# Patient Record
Sex: Female | Born: 1975 | Race: Black or African American | Hispanic: No | Smoking: Never smoker
Health system: Southern US, Community
[De-identification: ages and names within clinical notes are randomized; demographics above are authoritative.]

## PROBLEM LIST (undated history)

## (undated) DIAGNOSIS — J45909 Unspecified asthma, uncomplicated: Secondary | ICD-10-CM

## (undated) DIAGNOSIS — Z86711 Personal history of pulmonary embolism: Secondary | ICD-10-CM

## (undated) HISTORY — DX: Personal history of pulmonary embolism: Z86.711

## (undated) HISTORY — PX: TUBAL LIGATION: SHX77

## (undated) HISTORY — PX: ENDOMETRIAL ABLATION: SHX621

## (undated) HISTORY — DX: Unspecified asthma, uncomplicated: J45.909

---

## 2008-05-29 ENCOUNTER — Other Ambulatory Visit: Admission: RE | Admit: 2008-05-29 | Discharge: 2008-05-29 | Payer: Self-pay | Admitting: Obstetrics and Gynecology

## 2010-12-07 HISTORY — PX: PLACEMENT OF BREAST IMPLANTS: SHX6334

## 2016-12-21 DIAGNOSIS — Z1231 Encounter for screening mammogram for malignant neoplasm of breast: Secondary | ICD-10-CM | POA: Diagnosis not present

## 2016-12-21 LAB — HM MAMMOGRAPHY

## 2017-07-06 DIAGNOSIS — Z01419 Encounter for gynecological examination (general) (routine) without abnormal findings: Secondary | ICD-10-CM | POA: Diagnosis not present

## 2017-07-06 DIAGNOSIS — Z87898 Personal history of other specified conditions: Secondary | ICD-10-CM | POA: Diagnosis not present

## 2017-07-08 LAB — HM PAP SMEAR: HM PAP: NEGATIVE

## 2017-10-05 ENCOUNTER — Ambulatory Visit (INDEPENDENT_AMBULATORY_CARE_PROVIDER_SITE_OTHER): Payer: 59 | Admitting: Family Medicine

## 2017-10-05 ENCOUNTER — Encounter: Payer: Self-pay | Admitting: Family Medicine

## 2017-10-05 VITALS — BP 110/70 | HR 61 | Ht 65.5 in | Wt 142.4 lb

## 2017-10-05 DIAGNOSIS — Z0000 Encounter for general adult medical examination without abnormal findings: Secondary | ICD-10-CM

## 2017-10-05 DIAGNOSIS — J45909 Unspecified asthma, uncomplicated: Secondary | ICD-10-CM | POA: Insufficient documentation

## 2017-10-05 DIAGNOSIS — H547 Unspecified visual loss: Secondary | ICD-10-CM

## 2017-10-05 DIAGNOSIS — Z86711 Personal history of pulmonary embolism: Secondary | ICD-10-CM | POA: Insufficient documentation

## 2017-10-05 LAB — LIPID PANEL
CHOL/HDL RATIO: 2.7 (calc) (ref <5.0)
CHOLESTEROL: 234 mg/dL — AB (ref <200)
HDL: 88 mg/dL (ref >50)
LDL Cholesterol (Calc): 131 mg/dL (calc) — ABNORMAL HIGH
NON-HDL CHOLESTEROL (CALC): 146 mg/dL — AB (ref <130)
TRIGLYCERIDES: 62 mg/dL (ref <150)

## 2017-10-05 LAB — POCT URINALYSIS DIP (PROADVANTAGE DEVICE)
BILIRUBIN UA: NEGATIVE
BILIRUBIN UA: NEGATIVE mg/dL
Blood, UA: NEGATIVE
GLUCOSE UA: NEGATIVE mg/dL
LEUKOCYTES UA: NEGATIVE
NITRITE UA: NEGATIVE
Protein Ur, POC: NEGATIVE mg/dL
Specific Gravity, Urine: 1.015
Urobilinogen, Ur: NEGATIVE
pH, UA: 6 (ref 5.0–8.0)

## 2017-10-05 LAB — CBC WITH DIFFERENTIAL/PLATELET
BASOS ABS: 30 {cells}/uL (ref 0–200)
BASOS PCT: 0.5 %
EOS ABS: 71 {cells}/uL (ref 15–500)
Eosinophils Relative: 1.2 %
HEMATOCRIT: 36.7 % (ref 35.0–45.0)
HEMOGLOBIN: 12.2 g/dL (ref 11.7–15.5)
LYMPHS ABS: 2513 {cells}/uL (ref 850–3900)
MCH: 29.9 pg (ref 27.0–33.0)
MCHC: 33.2 g/dL (ref 32.0–36.0)
MCV: 90 fL (ref 80.0–100.0)
MPV: 13.6 fL — AB (ref 7.5–12.5)
Monocytes Relative: 6.9 %
NEUTROS ABS: 2879 {cells}/uL (ref 1500–7800)
Neutrophils Relative %: 48.8 %
Platelets: 151 10*3/uL (ref 140–400)
RBC: 4.08 10*6/uL (ref 3.80–5.10)
RDW: 12 % (ref 11.0–15.0)
Total Lymphocyte: 42.6 %
WBC mixed population: 407 cells/uL (ref 200–950)
WBC: 5.9 10*3/uL (ref 3.8–10.8)

## 2017-10-05 LAB — COMPREHENSIVE METABOLIC PANEL
AG Ratio: 1.4 (calc) (ref 1.0–2.5)
ALKALINE PHOSPHATASE (APISO): 59 U/L (ref 33–115)
ALT: 12 U/L (ref 6–29)
AST: 22 U/L (ref 10–30)
Albumin: 4.3 g/dL (ref 3.6–5.1)
BILIRUBIN TOTAL: 0.5 mg/dL (ref 0.2–1.2)
BUN: 17 mg/dL (ref 7–25)
CHLORIDE: 103 mmol/L (ref 98–110)
CO2: 24 mmol/L (ref 20–32)
CREATININE: 0.92 mg/dL (ref 0.50–1.10)
Calcium: 9.2 mg/dL (ref 8.6–10.2)
GLOBULIN: 3 g/dL (ref 1.9–3.7)
GLUCOSE: 79 mg/dL (ref 65–99)
Potassium: 4.5 mmol/L (ref 3.5–5.3)
Sodium: 136 mmol/L (ref 135–146)
TOTAL PROTEIN: 7.3 g/dL (ref 6.1–8.1)

## 2017-10-05 NOTE — Patient Instructions (Addendum)
Call and schedule a dental cleaning.  Also, call and schedule an eye exam.   We will call you with your lab results.    Preventative Care for Adults - Female      MAINTAIN REGULAR HEALTH EXAMS:  A routine yearly physical is a good way to check in with your primary care provider about your health and preventive screening. It is also an opportunity to share updates about your health and any concerns you have, and receive a thorough all-over exam.   Most health insurance companies pay for at least some preventative services.  Check with your health plan for specific coverages.  WHAT PREVENTATIVE SERVICES DO WOMEN NEED?  Adult women should have their weight and blood pressure checked regularly.   Women age 41 and older should have their cholesterol levels checked regularly.  Women should be screened for cervical cancer with a Pap smear and pelvic exam beginning at age 41.   Breast cancer screening generally begins at age 41 with a mammogram and breast exam by your primary care provider.    Beginning at age 41 and continuing to age 41, women should be screened for colorectal cancer.  Certain people may need continued testing until age 41.  Updating vaccinations is part of preventative care.  Vaccinations help protect against diseases such as the flu.  Osteoporosis is a disease in which the bones lose minerals and strength as we age. Women ages 4865 and over should discuss this with their caregivers, as should women after menopause who have other risk factors.  Lab tests are generally done as part of preventative care to screen for anemia and blood disorders, to screen for problems with the kidneys and liver, to screen for bladder problems, to check blood sugar, and to check your cholesterol level.  Preventative services generally include counseling about diet, exercise, avoiding tobacco, drugs, excessive alcohol consumption, and sexually transmitted infections.    GENERAL RECOMMENDATIONS  FOR GOOD HEALTH:  Healthy diet:  Eat a variety of foods, including fruit, vegetables, animal or vegetable protein, such as meat, fish, chicken, and eggs, or beans, lentils, tofu, and grains, such as rice.  Drink plenty of water daily.  Decrease saturated fat in the diet, avoid lots of red meat, processed foods, sweets, fast foods, and fried foods.  Exercise:  Aerobic exercise helps maintain good heart health. At least 30-40 minutes of moderate-intensity exercise is recommended. For example, a brisk walk that increases your heart rate and breathing. This should be done on most days of the week.   Find a type of exercise or a variety of exercises that you enjoy so that it becomes a part of your daily life.  Examples are running, walking, swimming, water aerobics, and biking.  For motivation and support, explore group exercise such as aerobic class, spin class, Zumba, Yoga,or  martial arts, etc.    Set exercise goals for yourself, such as a certain weight goal, walk or run in a race such as a 5k walk/run.  Speak to your primary care provider about exercise goals.  Disease prevention:  If you smoke or chew tobacco, find out from your caregiver how to quit. It can literally save your life, no matter how long you have been a tobacco user. If you do not use tobacco, never begin.   Maintain a healthy diet and normal weight. Increased weight leads to problems with blood pressure and diabetes.   The Body Mass Index or BMI is a way of measuring how  much of your body is fat. Having a BMI above 27 increases the risk of heart disease, diabetes, hypertension, stroke and other problems related to obesity. Your caregiver can help determine your BMI and based on it develop an exercise and dietary program to help you achieve or maintain this important measurement at a healthful level.  High blood pressure causes heart and blood vessel problems.  Persistent high blood pressure should be treated with medicine  if weight loss and exercise do not work.   Fat and cholesterol leaves deposits in your arteries that can block them. This causes heart disease and vessel disease elsewhere in your body.  If your cholesterol is found to be high, or if you have heart disease or certain other medical conditions, then you may need to have your cholesterol monitored frequently and be treated with medication.   Ask if you should have a cardiac stress test if your history suggests this. A stress test is a test done on a treadmill that looks for heart disease. This test can find disease prior to there being a problem.  Menopause can be associated with physical symptoms and risks. Hormone replacement therapy is available to decrease these. You should talk to your caregiver about whether starting or continuing to take hormones is right for you.   Osteoporosis is a disease in which the bones lose minerals and strength as we age. This can result in serious bone fractures. Risk of osteoporosis can be identified using a bone density scan. Women ages 11 and over should discuss this with their caregivers, as should women after menopause who have other risk factors. Ask your caregiver whether you should be taking a calcium supplement and Vitamin D, to reduce the rate of osteoporosis.   Avoid drinking alcohol in excess (more than two drinks per day).  Avoid use of street drugs. Do not share needles with anyone. Ask for professional help if you need assistance or instructions on stopping the use of alcohol, cigarettes, and/or drugs.  Brush your teeth twice a day with fluoride toothpaste, and floss once a day. Good oral hygiene prevents tooth decay and gum disease. The problems can be painful, unattractive, and can cause other health problems. Visit your dentist for a routine oral and dental check up and preventive care every 6-12 months.   Look at your skin regularly.  Use a mirror to look at your back. Notify your caregivers of changes  in moles, especially if there are changes in shapes, colors, a size larger than a pencil eraser, an irregular border, or development of new moles.  Safety:  Use seatbelts 100% of the time, whether driving or as a passenger.  Use safety devices such as hearing protection if you work in environments with loud noise or significant background noise.  Use safety glasses when doing any work that could send debris in to the eyes.  Use a helmet if you ride a bike or motorcycle.  Use appropriate safety gear for contact sports.  Talk to your caregiver about gun safety.  Use sunscreen with a SPF (or skin protection factor) of 15 or greater.  Lighter skinned people are at a greater risk of skin cancer. Don't forget to also wear sunglasses in order to protect your eyes from too much damaging sunlight. Damaging sunlight can accelerate cataract formation.   Practice safe sex. Use condoms. Condoms are used for birth control and to help reduce the spread of sexually transmitted infections (or STIs).  Some of the STIs  are gonorrhea (the clap), chlamydia, syphilis, trichomonas, herpes, HPV (human papilloma virus) and HIV (human immunodeficiency virus) which causes AIDS. The herpes, HIV and HPV are viral illnesses that have no cure. These can result in disability, cancer and death.   Keep carbon monoxide and smoke detectors in your home functioning at all times. Change the batteries every 6 months or use a model that plugs into the wall.   Vaccinations:  Stay up to date with your tetanus shots and other required immunizations. You should have a booster for tetanus every 10 years. Be sure to get your flu shot every year, since 5%-20% of the [place] population comes down with the flu. The flu vaccine changes each year, so being vaccinated once is not enough. Get your shot in the fall, before the flu season peaks.   Other vaccines to consider:  Human Papilloma Virus or HPV causes cancer of the cervix, and other infections  that can be transmitted from person to person. There is a vaccine for HPV, and females should get immunized between the ages of 52 and 25. It requires a series of 3 shots.   Pneumococcal vaccine to protect against certain types of pneumonia.  This is normally recommended for adults age 44 or older.  However, adults younger than 41 years old with certain underlying conditions such as diabetes, heart or lung disease should also receive the vaccine.  Shingles vaccine to protect against Varicella Zoster if you are older than age 64, or younger than 41 years old with certain underlying illness.  Hepatitis A vaccine to protect against a form of infection of the liver by a virus acquired from food.  Hepatitis B vaccine to protect against a form of infection of the liver by a virus acquired from blood or body fluids, particularly if you work in health care.  If you plan to travel internationally, check with your local health department for specific vaccination recommendations.  Cancer Screening:  Breast cancer screening is essential to preventive care for women. All women age 33 and older should perform a breast self-exam every month. At age 28 and older, women should have their caregiver complete a breast exam each year. Women at ages 70 and older should have a mammogram (x-ray film) of the breasts. Your caregiver can discuss how often you need mammograms.    Cervical cancer screening includes taking a Pap smear (sample of cells examined under a microscope) from the cervix (end of the uterus). It also includes testing for HPV (Human Papilloma Virus, which can cause cervical cancer). Screening and a pelvic exam should begin at age 1, or 3 years after a woman becomes sexually active. Screening should occur every year, with a Pap smear but no HPV testing, up to age 49. After age 64, you should have a Pap smear every 3 years with HPV testing, if no HPV was found previously.   Most routine colon cancer  screening begins at the age of 80. On a yearly basis, doctors may provide special easy to use take-home tests to check for hidden blood in the stool. Sigmoidoscopy or colonoscopy can detect the earliest forms of colon cancer and is life saving. These tests use a small camera at the end of a tube to directly examine the colon. Speak to your caregiver about this at age 40, when routine screening begins (and is repeated every 5 years unless early forms of pre-cancerous polyps or small growths are found).

## 2017-10-05 NOTE — Progress Notes (Signed)
Subjective:    Patient ID: Sara Allen, female    DOB: [birth year], 41 y.o.   MRN: [id number]  HPI Chief Complaint  Patient presents with  . new pt    new pt, fasting cpe. has form that needs to be filled out. sees obgyn. will get flu shot with her daughter   She is new to the practice and here for a complete physical exam. Previous medical care: no PCP in a while.  Last CPE: last year   Other providers: [facility]   History of PE x 2. This was worked up and denies bleeding disorder.  Reports first PE was related to birth control and her second was related to pregnancy. States she does not need anticoagulation.  Work up done at [facility], last one in ?2012.   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  Masters in Hotel managerkinesiology and health care admin.  Played college basketball at [employer].   States her family is in [place].  Diet: healthy diet  Excerise: 3-4 days per week.   Immunizations: Tdap up to date. Will get flu shot but not today.   Health maintenance:  Mammogram: March 2018  Colonoscopy: N/A Last Gynecological Exam: this year at her OB/GYN Last Menstrual cycle: ablation.  Last Dental Exam: 3 years ago  Last Eye Exam: years   Wears seatbelt always, smoke detectors in home and functioning, does not text while driving and feels safe in home environment.   Reviewed allergies, medications, past medical, surgical, family, and social history.    Review of Systems Review of Systems Constitutional: -fever, -chills, -sweats, -unexpected weight change,-fatigue ENT: -runny nose, -ear pain, -sore throat Cardiology:  -chest pain, -palpitations, -edema Respiratory: -cough, -shortness of breath, -wheezing Gastroenterology: -abdominal pain, -nausea, -vomiting, -diarrhea, -constipation  Hematology: -bleeding or bruising problems Musculoskeletal:  -arthralgias, -myalgias, -joint swelling, -back pain Ophthalmology: -vision changes Urology: -dysuria, -difficulty urinating, -hematuria, -urinary frequency, -urgency Neurology: -headache, -weakness, -tingling, -numbness       Objective:   Physical Exam BP 110/70   Pulse 61   Ht 5' 5.5" (1.664 m)   Wt 142 lb 6.4 oz (64.6 kg)   BMI 23.34 kg/m   General Appearance:    Alert, cooperative, no distress, appears stated age  Head:    Normocephalic, without obvious abnormality, atraumatic  Eyes:    PERRL, conjunctiva/corneas clear, EOM's intact, fundi    benign  Ears:    Normal TM's and external ear canals  Nose:   Nares normal, mucosa normal, no drainage or sinus   tenderness  Throat:   Lips, mucosa, and tongue normal; teeth and gums normal  Neck:   Supple, no lymphadenopathy;  thyroid:  no   enlargement/tenderness/nodules; no carotid   bruit or JVD  Back:    Spine nontender, no curvature, ROM normal, no CVA     tenderness  Lungs:     Clear to auscultation bilaterally without wheezes, rales or     ronchi; respirations unlabored  Chest Wall:    No tenderness or deformity   Heart:    Regular rate and rhythm, S1 and S2 normal, no murmur, rub   or gallop  Breast Exam:    Done at OB/GYN  Abdomen:     Soft, non-tender, nondistended, normoactive bowel sounds,    no masses, no hepatosplenomegaly  Genitalia:    Done at OB/GYN  Extremities:   No clubbing, cyanosis or edema  Pulses:   2+ and symmetric all extremities  Skin:   Skin color, texture, turgor normal, no rashes or lesions  Lymph nodes:   Cervical, supraclavicular, and axillary nodes normal  Neurologic:   CNII-XII intact, normal strength, sensation and gait; reflexes 2+ and symmetric throughout          Psych:   Normal mood, affect, hygiene and grooming.     Urinalysis dipstick: negative      Assessment & Plan:  Routine general medical examination at a health care facility - Plan: POCT Urinalysis DIP (Proadvantage Device),  CBC with Differential/Platelet, Comprehensive metabolic panel, Lipid panel  Decreased visual acuity  She appears to be doing well emotionally and physically.  Discussed safety and health promotion. She is taking good care of herself.  Will attempt to get medical records from her OB/GYN since she reports having updated mammogram and pap smear.  Follow up pending labs.

## 2017-10-12 ENCOUNTER — Encounter: Payer: Self-pay | Admitting: Family Medicine

## 2017-10-14 ENCOUNTER — Telehealth: Payer: Self-pay | Admitting: Family Medicine

## 2017-10-14 NOTE — Telephone Encounter (Signed)
I put this on your desk.

## 2017-10-14 NOTE — Telephone Encounter (Signed)
Faxed back form.

## 2017-10-14 NOTE — Telephone Encounter (Signed)
Pt faxed over a form for her work. It is a physical form. Please complete and fax to [phone]. Sending back to be completed.

## 2017-10-20 ENCOUNTER — Telehealth: Payer: Self-pay | Admitting: Family Medicine

## 2017-10-20 ENCOUNTER — Encounter: Payer: Self-pay | Admitting: Family Medicine

## 2017-10-20 NOTE — Telephone Encounter (Signed)
Received requested pap. Sending back for review.

## 2018-10-14 ENCOUNTER — Ambulatory Visit (INDEPENDENT_AMBULATORY_CARE_PROVIDER_SITE_OTHER): Payer: 59 | Admitting: Family Medicine

## 2018-10-14 ENCOUNTER — Encounter: Payer: Self-pay | Admitting: Family Medicine

## 2018-10-14 VITALS — BP 110/70 | HR 65 | Ht 65.0 in | Wt 145.8 lb

## 2018-10-14 DIAGNOSIS — E7800 Pure hypercholesterolemia, unspecified: Secondary | ICD-10-CM

## 2018-10-14 DIAGNOSIS — Z0000 Encounter for general adult medical examination without abnormal findings: Secondary | ICD-10-CM

## 2018-10-14 LAB — POCT URINALYSIS DIP (PROADVANTAGE DEVICE)
BILIRUBIN UA: NEGATIVE mg/dL
Bilirubin, UA: NEGATIVE
Glucose, UA: NEGATIVE mg/dL
Leukocytes, UA: NEGATIVE
Nitrite, UA: NEGATIVE
PROTEIN UA: NEGATIVE mg/dL
RBC UA: NEGATIVE
SPECIFIC GRAVITY, URINE: 1.005
UUROB: NEGATIVE
pH, UA: 7 (ref 5.0–8.0)

## 2018-10-14 NOTE — Patient Instructions (Signed)
It was a pleasure seeing you today.  Continue taking care of yourself by eating a healthy, well balanced diet and getting at least 150 minutes of physical activity per week.   You may return for a nurse visit at your convenience for a Tdap (Tetanus, Diptheria, Pertussis).   We will call with your lab results.    Preventative Care for Adults - Female      MAINTAIN REGULAR HEALTH EXAMS:  A routine yearly physical is a good way to check in with your primary care provider about your health and preventive screening. It is also an opportunity to share updates about your health and any concerns you have, and receive a thorough all-over exam.   Most health insurance companies pay for at least some preventative services.  Check with your health plan for specific coverages.  WHAT PREVENTATIVE SERVICES DO WOMEN NEED?  Adult women should have their weight and blood pressure checked regularly.   Women age 100 and older should have their cholesterol levels checked regularly.  Women should be screened for cervical cancer with a Pap smear and pelvic exam beginning at age 71.  Breast cancer screening generally begins at age 31 with a mammogram and breast exam by your primary care provider.    Beginning at age 12 and continuing to age 34, women should be screened for colorectal cancer.  Certain people may need continued testing until age 72.  Updating vaccinations is part of preventative care.  Vaccinations help protect against diseases such as the flu.  Osteoporosis is a disease in which the bones lose minerals and strength as we age. Women ages 12 and over should discuss this with their caregivers, as should women after menopause who have other risk factors.  Lab tests are generally done as part of preventative care to screen for anemia and blood disorders, to screen for problems with the kidneys and liver, to screen for bladder problems, to check blood sugar, and to check your cholesterol  level.  Preventative services generally include counseling about diet, exercise, avoiding tobacco, drugs, excessive alcohol consumption, and sexually transmitted infections.    GENERAL RECOMMENDATIONS FOR GOOD HEALTH:  Healthy diet:  Eat a variety of foods, including fruit, vegetables, animal or vegetable protein, such as meat, fish, chicken, and eggs, or beans, lentils, tofu, and grains, such as rice.  Drink plenty of water daily.  Decrease saturated fat in the diet, avoid lots of red meat, processed foods, sweets, fast foods, and fried foods.  Exercise:  Aerobic exercise helps maintain good heart health. At least 30-40 minutes of moderate-intensity exercise is recommended. For example, a brisk walk that increases your heart rate and breathing. This should be done on most days of the week.   Find a type of exercise or a variety of exercises that you enjoy so that it becomes a part of your daily life.  Examples are running, walking, swimming, water aerobics, and biking.  For motivation and support, explore group exercise such as aerobic class, spin class, Zumba, Yoga,or  martial arts, etc.    Set exercise goals for yourself, such as a certain weight goal, walk or run in a race such as a 5k walk/run.  Speak to your primary care provider about exercise goals.  Disease prevention:  If you smoke or chew tobacco, find out from your caregiver how to quit. It can literally save your life, no matter how long you have been a tobacco user. If you do not use tobacco, never begin.  Maintain a healthy diet and normal weight. Increased weight leads to problems with blood pressure and diabetes.   The Body Mass Index or BMI is a way of measuring how much of your body is fat. Having a BMI above 27 increases the risk of heart disease, diabetes, hypertension, stroke and other problems related to obesity. Your caregiver can help determine your BMI and based on it develop an exercise and dietary program to  help you achieve or maintain this important measurement at a healthful level.  High blood pressure causes heart and blood vessel problems.  Persistent high blood pressure should be treated with medicine if weight loss and exercise do not work.   Fat and cholesterol leaves deposits in your arteries that can block them. This causes heart disease and vessel disease elsewhere in your body.  If your cholesterol is found to be high, or if you have heart disease or certain other medical conditions, then you may need to have your cholesterol monitored frequently and be treated with medication.   Ask if you should have a cardiac stress test if your history suggests this. A stress test is a test done on a treadmill that looks for heart disease. This test can find disease prior to there being a problem.  Menopause can be associated with physical symptoms and risks. Hormone replacement therapy is available to decrease these. You should talk to your caregiver about whether starting or continuing to take hormones is right for you.   Osteoporosis is a disease in which the bones lose minerals and strength as we age. This can result in serious bone fractures. Risk of osteoporosis can be identified using a bone density scan. Women ages 23 and over should discuss this with their caregivers, as should women after menopause who have other risk factors. Ask your caregiver whether you should be taking a calcium supplement and Vitamin D, to reduce the rate of osteoporosis.   Avoid drinking alcohol in excess (more than two drinks per day).  Avoid use of street drugs. Do not share needles with anyone. Ask for professional help if you need assistance or instructions on stopping the use of alcohol, cigarettes, and/or drugs.  Brush your teeth twice a day with fluoride toothpaste, and floss once a day. Good oral hygiene prevents tooth decay and gum disease. The problems can be painful, unattractive, and can cause other health  problems. Visit your dentist for a routine oral and dental check up and preventive care every 6-12 months.   Look at your skin regularly.  Use a mirror to look at your back. Notify your caregivers of changes in moles, especially if there are changes in shapes, colors, a size larger than a pencil eraser, an irregular border, or development of new moles.  Safety:  Use seatbelts 100% of the time, whether driving or as a passenger.  Use safety devices such as hearing protection if you work in environments with loud noise or significant background noise.  Use safety glasses when doing any work that could send debris in to the eyes.  Use a helmet if you ride a bike or motorcycle.  Use appropriate safety gear for contact sports.  Talk to your caregiver about gun safety.  Use sunscreen with a SPF (or skin protection factor) of 15 or greater.  Lighter skinned people are at a greater risk of skin cancer. Don't forget to also wear sunglasses in order to protect your eyes from too much damaging sunlight. Damaging sunlight can accelerate cataract  formation.   Practice safe sex. Use condoms. Condoms are used for birth control and to help reduce the spread of sexually transmitted infections (or STIs).  Some of the STIs are gonorrhea (the clap), chlamydia, syphilis, trichomonas, herpes, HPV (human papilloma virus) and HIV (human immunodeficiency virus) which causes AIDS. The herpes, HIV and HPV are viral illnesses that have no cure. These can result in disability, cancer and death.   Keep carbon monoxide and smoke detectors in your home functioning at all times. Change the batteries every 6 months or use a model that plugs into the wall.   Vaccinations:  Stay up to date with your tetanus shots and other required immunizations. You should have a booster for tetanus every 10 years. Be sure to get your flu shot every year, since 5%-20% of the [place] population comes down with the flu. The flu vaccine changes each year,  so being vaccinated once is not enough. Get your shot in the fall, before the flu season peaks.   Other vaccines to consider:  Human Papilloma Virus or HPV causes cancer of the cervix, and other infections that can be transmitted from person to person. There is a vaccine for HPV, and females should get immunized between the ages of 48 and 9. It requires a series of 3 shots.   Pneumococcal vaccine to protect against certain types of pneumonia.  This is normally recommended for adults age 31 or older.  However, adults younger than 42 years old with certain underlying conditions such as diabetes, heart or lung disease should also receive the vaccine.  Shingles vaccine to protect against Varicella Zoster if you are older than age 5, or younger than 42 years old with certain underlying illness.  Hepatitis A vaccine to protect against a form of infection of the liver by a virus acquired from food.  Hepatitis B vaccine to protect against a form of infection of the liver by a virus acquired from blood or body fluids, particularly if you work in health care.  If you plan to travel internationally, check with your local health department for specific vaccination recommendations.  Cancer Screening:  Breast cancer screening is essential to preventive care for women. All women age 32 and older should perform a breast self-exam every month. At age 31 and older, women should have their caregiver complete a breast exam each year. Women at ages 7 and older should have a mammogram (x-ray film) of the breasts. Your caregiver can discuss how often you need mammograms.    Cervical cancer screening includes taking a Pap smear (sample of cells examined under a microscope) from the cervix (end of the uterus). It also includes testing for HPV (Human Papilloma Virus, which can cause cervical cancer). Screening and a pelvic exam should begin at age 17. Screening should occur every year, with a Pap smear but no HPV  testing, up to age 2. After age 34, you should have a Pap smear every 3 years with HPV testing, if no HPV was found previously.   Most routine colon cancer screening begins at the age of 57. On a yearly basis, doctors may provide special easy to use take-home tests to check for hidden blood in the stool. Sigmoidoscopy or colonoscopy can detect the earliest forms of colon cancer and is life saving. These tests use a small camera at the end of a tube to directly examine the colon. Speak to your caregiver about this at age 67, when routine screening begins (and is repeated  every 5 years unless early forms of pre-cancerous polyps or small growths are found).

## 2018-10-14 NOTE — Progress Notes (Signed)
Subjective:    Patient ID: Sara Allen, female    DOB: February 28, 1976, 42 y.o.   MRN: [id number]  HPI Chief Complaint  Patient presents with  . fasting cpe    fasting cpe, sees obgyn. no other concerns.    She is here for a complete physical exam. No concerns today.   History of previous nerve entrapment. PT has helped.   Other providers: OB/GYN- Dr. Allena Katz in [place]  History of PE x 2. This was worked up and denies bleeding disorder.  Reports first PE was related to birth control and her second was related to pregnancy. States she does not need anticoagulation.   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  She has a Scientist, water quality in Therapist, music.  Played college basketball at [employer].   Denies smoking, drinking alcohol, drug use  Diet: fairly healthy  Excerise: elliptical and pool work.   Immunizations: Tdap unknown   Health maintenance:  Mammogram: 12/2016 History of ablation.  Last Dental Exam: twice annually  Last Eye Exam: last year.   Wears seatbelt always, uses sunscreen, smoke detectors in home and functioning, does not text while driving and feels safe in home environment.   Reviewed allergies, medications, past medical, surgical, family, and social history.    Review of Systems Review of Systems Constitutional: -fever, -chills, -sweats, -unexpected weight change,-fatigue ENT: -runny nose, -ear pain, -sore throat Cardiology:  -chest pain, -palpitations, -edema Respiratory: -cough, -shortness of breath, -wheezing Gastroenterology: -abdominal pain, -nausea, -vomiting, -diarrhea, -constipation  Hematology: -bleeding or bruising problems Musculoskeletal: -arthralgias, -myalgias, -joint swelling, -back pain Ophthalmology: -vision changes Urology: -dysuria, -difficulty urinating, -hematuria, -urinary frequency, -urgency Neurology: -headache, -weakness, -tingling,  -numbness       Objective:   Physical Exam BP 110/70   Pulse 65   Ht 5\' 5"  (1.651 m)   Wt 145 lb 12.8 oz (66.1 kg)   SpO2 99%   BMI 24.26 kg/m   General Appearance:    Alert, cooperative, no distress, appears stated age  Head:    Normocephalic, without obvious abnormality, atraumatic  Eyes:    PERRL, conjunctiva/corneas clear, EOM's intact, fundi    benign  Ears:    Normal TM's and external ear canals  Nose:   Nares normal, mucosa normal, no drainage or sinus   tenderness  Throat:   Lips, mucosa, and tongue normal; teeth and gums normal  Neck:   Supple, no lymphadenopathy;  thyroid:  no   enlargement/tenderness/nodules; no carotid   bruit or JVD  Back:    Spine nontender, no curvature, ROM normal, no CVA     tenderness  Lungs:     Clear to auscultation bilaterally without wheezes, rales or     ronchi; respirations unlabored  Chest Wall:    No tenderness or deformity   Heart:    Regular rate and rhythm, S1 and S2 normal, no murmur, rub   or gallop  Breast Exam:    OB/GYN  Abdomen:     Soft, non-tender, nondistended, normoactive bowel sounds,    no masses, no hepatosplenomegaly  Genitalia:    OB/GYN     Extremities:   No clubbing, cyanosis or edema  Pulses:   2+ and symmetric all extremities  Skin:   Skin color, texture, turgor normal, no rashes or lesions  Lymph nodes:   Cervical, supraclavicular, and axillary nodes normal  Neurologic:   CNII-XII intact, normal strength, sensation and gait;  reflexes 2+ and symmetric throughout          Psych:   Normal mood, affect, hygiene and grooming.    Urinalysis dipstick: negative       Assessment & Plan:  Routine general medical examination at a health care facility - Plan: POCT Urinalysis DIP (Proadvantage Device), CBC with Differential/Platelet, Comprehensive metabolic panel, TSH  Elevated LDL cholesterol level - Plan: Lipid panel  She is doing well emotionally and physically. Encouraged her to continue taking good care of  herself with a healthy diet and exercise. Followed by OB/GYN for mammograms and Pap smears. Discussed LDL results from last year and counseling on healthy diet to lower this.  Recheck fasting lipids today. We immunizations reviewed and she may be overdue for Tdap.  She will check and return for nurse visit to get this updated if needed.  I did counsel her on the vaccine. Follow-up pending labs or in 1 year

## 2018-10-15 LAB — CBC WITH DIFFERENTIAL/PLATELET
BASOS ABS: 0 10*3/uL (ref 0.0–0.2)
Basos: 0 %
EOS (ABSOLUTE): 0.1 10*3/uL (ref 0.0–0.4)
Eos: 1 %
HEMATOCRIT: 36.4 % (ref 34.0–46.6)
Hemoglobin: 12.1 g/dL (ref 11.1–15.9)
Immature Grans (Abs): 0 10*3/uL (ref 0.0–0.1)
Immature Granulocytes: 0 %
LYMPHS ABS: 2.8 10*3/uL (ref 0.7–3.1)
Lymphs: 31 %
MCH: 29.9 pg (ref 26.6–33.0)
MCHC: 33.2 g/dL (ref 31.5–35.7)
MCV: 90 fL (ref 79–97)
MONOS ABS: 0.5 10*3/uL (ref 0.1–0.9)
Monocytes: 6 %
NEUTROS ABS: 5.7 10*3/uL (ref 1.4–7.0)
Neutrophils: 62 %
Platelets: 207 10*3/uL (ref 150–450)
RBC: 4.05 x10E6/uL (ref 3.77–5.28)
RDW: 12.2 % — AB (ref 12.3–15.4)
WBC: 9.2 10*3/uL (ref 3.4–10.8)

## 2018-10-15 LAB — COMPREHENSIVE METABOLIC PANEL
ALBUMIN: 4.2 g/dL (ref 3.5–5.5)
ALK PHOS: 59 IU/L (ref 39–117)
ALT: 17 IU/L (ref 0–32)
AST: 27 IU/L (ref 0–40)
Albumin/Globulin Ratio: 1.5 (ref 1.2–2.2)
BILIRUBIN TOTAL: 0.5 mg/dL (ref 0.0–1.2)
BUN / CREAT RATIO: 13 (ref 9–23)
BUN: 11 mg/dL (ref 6–24)
CHLORIDE: 99 mmol/L (ref 96–106)
CO2: 21 mmol/L (ref 20–29)
CREATININE: 0.84 mg/dL (ref 0.57–1.00)
Calcium: 9.2 mg/dL (ref 8.7–10.2)
GFR calc Af Amer: 99 mL/min/{1.73_m2} (ref >59)
GFR calc non Af Amer: 86 mL/min/{1.73_m2} (ref >59)
GLOBULIN, TOTAL: 2.8 g/dL (ref 1.5–4.5)
GLUCOSE: 78 mg/dL (ref 65–99)
Potassium: 4.6 mmol/L (ref 3.5–5.2)
SODIUM: 138 mmol/L (ref 134–144)
Total Protein: 7 g/dL (ref 6.0–8.5)

## 2018-10-15 LAB — LIPID PANEL
CHOLESTEROL TOTAL: 233 mg/dL — AB (ref 100–199)
Chol/HDL Ratio: 2.9 ratio (ref 0.0–4.4)
HDL: 79 mg/dL (ref >39)
LDL Calculated: 146 mg/dL — ABNORMAL HIGH (ref 0–99)
Triglycerides: 41 mg/dL (ref 0–149)
VLDL CHOLESTEROL CAL: 8 mg/dL (ref 5–40)

## 2018-10-15 LAB — TSH: TSH: 2.05 u[IU]/mL (ref 0.450–4.500)

## 2019-11-28 ENCOUNTER — Telehealth: Payer: Self-pay | Admitting: Family Medicine

## 2019-11-28 NOTE — Telephone Encounter (Signed)
Pt called and states that she thinks she has a blood clot in her leg. Per pt she has a history of this, she states she has had two previously. Pt then states she is in [place]. I discussed situation with Vickie and per Vickie pt is to go to ER ASAP and have accessed. When message was relayed to pt she was hesitant stating [place] is "COVID central". I stressed multiple times the importance of having this addressed. Pt wanted to make an appt with [place] when she can home. I made an appt with Dr. Tomi Bamberger for Thursday as Vickie is out of office. Sending this message back to [place].

## 2019-11-28 NOTE — Telephone Encounter (Signed)
Thank you. She will hopefully take our advice and get immediate medical attention where she is and not wait until later this week.

## 2019-11-29 NOTE — Progress Notes (Signed)
Chief Complaint  Patient presents with  . Follow-up    postive DVT right leg.    Patient presents for ER follow-up, where she was diagnosed and treatment started for DVT on 12/22.  She drove to [place] on 12/20. She travels a lot for work, usually flies, takes low dose aspirin prior and would wear compression socks, but admits she didn't remember to do this prior to the drive. She took breaks every 3-4 hours. By 7 hours she started having R calf cramping.  She tried walking around, but that didn't seem to help. She went to a pharmacy and bought 81mg  aspirin, took 3. Upon arrival she still had discomfort. 12/21 she still felt the R calf was tight and sore.  Took two more aspirin, two times that day. 12/22 she took more aspirin, but when calf was still tight and stiff she called Vickie, who told her to go to ER.  She went to ER in [place], [place] on 12/22. She had 1/23 which revealed DVT. They did not perform CT angio, which concerns her, as she was found to have PE on CT angio the second time when she didn't have any pulmonary symptoms, and felt like she had also caught the DVT early.  Per reviewed records, US showed: Partial DVT of R popliteal vein. Given its eccentric configuration and heterogeneous echogenicity, this is favored to be chronic.  She was started on Eliquis (10mg  BID x 7 days, rx'd #28). Her boyfriend flew down and drove her back. Stopped every 2 hours, and wore compression stockings.  Yesterday she still had some calf soreness, today it feels the best, denies any calf pain, no longer feels tight.  While seated for a long time at visit, she felt some numbness develop in RLE, felt better when she stood/moved.  She denies ever having leg swelling (just the tightness). She denies chest pain, shortness of breath.  History of DVT with PE x 2. This was reportedly worked up and denies bleeding disorder.States that first PE was related to birth control (age 43), treated x 1 year with  coumadin. Second was related to pregnancy, treated with coumadin x 6 months.  She was under care of hematologist at [facility].  Labs are visible in CareEverywhere from [facility] ([place]) from 11/2008, briefly reviewed.  Last mammogram was 08/07/2019 (at [facility]), normal. Pap smear 06/2019, +HR HPV, but 16/18 were negative  PMH, PSH, SH, FH reviewed, updated  Outpatient Encounter Medications as of 11/30/2019  Medication Sig Note  . ELIQUIS 5 MG TABS tablet Take 1 tablet (5 mg total) by mouth 2 (two) times daily.   . [DISCONTINUED] ELIQUIS 5 MG TABS tablet Take 5 mg by mouth 2 (two) times daily. 11/30/2019: Taking 10mg  BID x 7 days and then 5 mg   No facility-administered encounter medications on file as of 11/30/2019.    No Known Allergies  ROS: no fever, chills, URI symptoms, cough, shortness of breath, chest pain.  R calf pain has resolved.  Denies any GI complaints, bleeding, bruising, or other concerns.  See HPI.   PHYSICAL EXAM:  BP 98/60   Pulse 92   Temp (!) 97.3 F (36.3 C) (Other (Comment))   Ht 5\' 6"  (1.676 m)   Wt 153 lb 12.8 oz (69.8 kg)   BMI 24.82 kg/m   Wt Readings from Last 3 Encounters:  10/14/18 145 lb 12.8 oz (66.1 kg)  10/05/17 142 lb 6.4 oz (64.6 kg)   Well-appearing, pleasant female, wearing mask,  in no distress She seemed somewhat uncomfortable (leg numbness from sitting), which resolved with getting up and moving around HEENT: conjunctiva and sclera are clear, EOMI.  Wearing mask Neck: no lymphadenopathy, thyromegaly or mass Heart: regular rate and rhythm Lungs: clear bilaterally Abdomen: soft, nontender, no mass Extremities: RLE: 2+ pulses.  Only trace edema at sock line, no other swelling noted.  Calf is soft, nontender, no cords. Negative Homan sign. Psych: normal mood, affect, hygiene and grooming Neuro: alert and oriented, normal gait.   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 Refer to hematology for consult. Discussed leg elevation prn pain, compression stocking when comfortable - Plan: Ambulatory referral to Hematology, ELIQUIS 5 MG TABS tablet  She had many questions, related to whether there is any damage to veins, if she should avoid travel and continue to work from home (longterm). Discussed that being on anticoagulants should protect/prevent her from getting recurrent DVT/PE, so shouldn't have to work from home long-term, but recommended over the next 3 months. Prev did well, NOT on blood thinners) when taking aspirin and using compression stockings with travel.  Adding the blood thinners would further protect her.  She will also ask hematologist same questions for their opinion.   Last saw Vickie for CPE 10/2018; doesn't have any f/u/CPE scheduled, plans to schedule CPE after setting in after moving to [place] next month.   Preferred to not have blood drawn today (felt dehydrated, is a hard stick). Will hold off until done by hematologist.  All questions answered to best of my ability.  ER records and prior records available in epic and Care Everywhere reviewed.    This review continued after her appt.  No hematology notes available from 11/2008, just labs. 02/2013 saw vascular surgeon (Dr. 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  He recommended compression hose and referral to Dr. Silvio Pate for consideration of treatment of VV.

## 2019-11-30 ENCOUNTER — Inpatient Hospital Stay: Payer: 59 | Admitting: Family Medicine

## 2019-11-30 ENCOUNTER — Other Ambulatory Visit: Payer: Self-pay

## 2019-11-30 ENCOUNTER — Ambulatory Visit (INDEPENDENT_AMBULATORY_CARE_PROVIDER_SITE_OTHER): Payer: 59 | Admitting: Family Medicine

## 2019-11-30 ENCOUNTER — Encounter: Payer: Self-pay | Admitting: Family Medicine

## 2019-11-30 VITALS — BP 98/60 | HR 92 | Temp 97.3°F | Ht 66.0 in | Wt 153.8 lb

## 2019-11-30 DIAGNOSIS — I82401 Acute embolism and thrombosis of unspecified deep veins of right lower extremity: Secondary | ICD-10-CM

## 2019-11-30 MED ORDER — ELIQUIS 5 MG PO TABS: 5.0000 mg | ORAL_TABLET | Freq: Two times a day (BID) | ORAL | 5 refills | Status: DC

## 2019-11-30 NOTE — Patient Instructions (Addendum)
Complete the 2 pills twice daily for the week (the bottle prescribed by ER). Then start taking 1 pill twice daily. I suspect that you will need to be on blood thinners for the rest of your life, since this is your third clot.  We are referring you to hematology for a consult.  They likely will want to repeat the work-up for any underlying causes.  I did see that it was done through [facility] (in [facility]) in 11/2008.  Keep your leg elevated if you have discomfort or swelling.  It is okay to remain active. Consider restarting compression stockings/socks when comfortable.  If you develop any chest pain or shortness of breath, please go to the ER to be evaluated for a pulmonary embolism. This should be prevented by the blood thinners, however if you develop significant symptoms you need urgent re-evaluation.   Deep Vein Thrombosis  Deep vein thrombosis (DVT) is a condition in which a blood clot forms in a deep vein, such as a lower leg, thigh, or arm vein. A clot is blood that has thickened into a gel or solid. This condition is dangerous. It can lead to serious and even life-threatening complications if the clot travels to the lungs and causes a blockage (pulmonary embolism). It can also damage veins in the leg. This can result in leg pain, swelling, discoloration, and sores (post-thrombotic syndrome). What are the causes? This condition may be caused by:  A slowdown of blood flow.  Damage to a vein.  A condition that causes blood to clot more easily, such as an inherited clotting disorder. What increases the risk? The following factors may make you more likely to develop this condition:  Being overweight.  Being older, especially over age 100.  Sitting or lying down for more than four hours.  Being in the hospital.  Lack of physical activity (sedentary lifestyle).  Pregnancy, being in childbirth, or having recently given birth.  Taking medicines that contain estrogen, such as  medicines to prevent pregnancy.  Smoking.  A history of any of the following: ? Blood clots or a blood clotting disease. ? Peripheral vascular disease. ? Inflammatory bowel disease. ? Cancer. ? Heart disease. ? Genetic conditions that affect how your blood clots, such as Factor V Leiden mutation. ? Neurological diseases that affect your legs (leg paresis). ? A recent injury, such as a car accident. ? Major or lengthy surgery. ? A central line placed inside a large vein. What are the signs or symptoms? Symptoms of this condition include:  Swelling, pain, or tenderness in an arm or leg.  Warmth, redness, or discoloration in an arm or leg. If the clot is in your leg, symptoms may be more noticeable or worse when you stand or walk. Some people may not develop any symptoms. How is this diagnosed? This condition is diagnosed with:  A medical history and physical exam.  Tests, such as: ? Blood tests. These are done to check how well your blood clots. ? Ultrasound. This is done to check for clots. ? Venogram. For this test, contrast dye is injected into a vein and X-rays are taken to check for any clots. How is this treated? Treatment for this condition depends on:  The cause of your DVT.  Your risk for bleeding or developing more clots.  Any other medical conditions that you have. Treatment may include:  Taking a blood thinner (anticoagulant). This type of medicine prevents clots from forming. It may be taken by mouth, injected under  the skin, or injected through an IV (catheter).  Injecting clot-dissolving medicines into the affected vein (catheter-directed thrombolysis).  Having surgery. Surgery may be done to: ? Remove the clot. ? Place a filter in a large vein to catch blood clots before they reach the lungs. Some treatments may be continued for up to six months. Follow these instructions at home: If you are taking blood thinners:  Take the medicine exactly as told by  your health care provider. Some blood thinners need to be taken at the same time every day. Do not skip a dose.  Talk with your health care provider before you take any medicines that contain aspirin or NSAIDs. These medicines increase your risk for dangerous bleeding.  Ask your health care provider about foods and drugs that could change the way the medicine works (may interact). Avoid those things if your health care provider tells you to do so.  Blood thinners can cause easy bruising and may make it difficult to stop bleeding. Because of this: ? Be very careful when using knives, scissors, or other sharp objects. ? Use an electric razor instead of a blade. ? Avoid activities that could cause injury or bruising, and follow instructions about how to prevent falls.  Wear a medical alert bracelet or carry a card that lists what medicines you take. General instructions  Take over-the-counter and prescription medicines only as told by your health care provider.  Return to your normal activities as told by your health care provider. Ask your health care provider what activities are safe for you.  Wear compression stockings if recommended by your health care provider.  Keep all follow-up visits as told by your health care provider. This is important. How is this prevented? To lower your risk of developing this condition again:  For 30 or more minutes every day, do an activity that: ? Involves moving your arms and legs. ? Increases your heart rate.  When traveling for longer than four hours: ? Exercise your arms and legs every hour. ? Drink plenty of water. ? Avoid drinking alcohol.  Avoid sitting or lying for a long time without moving your legs.  If you have surgery or you are hospitalized, ask about ways to prevent blood clots. These may include taking frequent walks or using anticoagulants.  Stay at a healthy weight.  If you are a woman who is older than age 89, avoid unnecessary  use of medicines that contain estrogen, such as some birth control pills.  Do not use any products that contain nicotine or tobacco, such as cigarettes and e-cigarettes. This is especially important if you take estrogen medicines. If you need help quitting, ask your health care provider. Contact a health care provider if:  You miss a dose of your blood thinner.  Your menstrual period is heavier than usual.  You have unusual bruising. Get help right away if:  You have: ? New or increased pain, swelling, or redness in an arm or leg. ? Numbness or tingling in an arm or leg. ? Shortness of breath. ? Chest pain. ? A rapid or irregular heartbeat. ? A severe headache or confusion. ? A cut that will not stop bleeding.  There is blood in your vomit, stool, or urine.  You have a serious fall or accident, or you hit your head.  You feel light-headed or dizzy.  You cough up blood. These symptoms may represent a serious problem that is an emergency. Do not wait to see if the  symptoms will go away. Get medical help right away. Call your local emergency services (911 in the [place] Do not drive yourself to the hospital. Summary  Deep vein thrombosis (DVT) is a condition in which a blood clot forms in a deep vein, such as a lower leg, thigh, or arm vein.  Symptoms can include swelling, warmth, pain, and redness in your leg or arm.  This condition may be treated with a blood thinner (anticoagulant medicine), medicine that is injected to dissolve blood clots,compression stockings, or surgery.  If you are prescribed blood thinners, take them exactly as told. This information is not intended to replace advice given to you by your health care provider. Make sure you discuss any questions you have with your health care provider. Document Released: 11/23/2005 Document Revised: 11/05/2017 Document Reviewed: 04/23/2017 Elsevier Patient Education  2020 [employer]

## 2019-12-05 ENCOUNTER — Telehealth: Payer: Self-pay | Admitting: Hematology and Oncology

## 2019-12-05 NOTE — Telephone Encounter (Signed)
Patient returned phone call regarding voicemail that was left, scheduled per new patient referral. Patient is notified.

## 2019-12-17 NOTE — Progress Notes (Signed)
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 Treated x 1 year with coumadin 2) 11/2008: LE DVT and Pulmonary embolism diagnosed during pregnancy. Treated x 1 year with coumadin. Hypercoag workup including APS antibodies, FVL, Prothrombin gene mutation, Protein C and Protein S levels reveal no clear etiology.  3) 11/28/2019: RLE DVT diagnosed in [place] after 7 hour car trip. Initiated treatment with apixaban. 4) 12/18/2019: establish care with Dr. Leonides Schanz   CHIEF COMPLAINTS/PURPOSE OF CONSULTATION:  Recurrent VTEs   HISTORY OF PRESENTING ILLNESS:  Sara Allen 44 y.o. female with medical history significant for recurrent VTEs who presents for recommendations regarding anticoagulation.   On review of prior records Sara Allen was initially diagnosed with DVT PE while on oral contraceptive pills at the age of approximately 42 in the year 2001.  She was treated with 1 year of Coumadin at that time.  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  This pregnancy was terminated, and the patient was treated for 1 year with Coumadin.  She underwent a hypercoagulation work-up which included antiphospholipid antibodies, factor V Leiden, prothrombin gene mutation, protein C, protein S and these revealed no clear etiology.  Patient was in her normal state of health until December of this year when she undertook a road trip down to [place].  She reports that she drove for 3 hours got out and stretched and then for another 3 to 4 hours before developing stiffness and pain in her right calf.  While in [place] she sought medical attention and underwent an ultrasound in [place] which diagnosed her with a lower extremity DVT.  She  was started on apixaban therapy and was referred to hematology for further evaluation and management.  On exam today Sara Allen notes that she feels well.  She reports that she has been doing what she can to elevate her right leg and exercise it in order to prevent pain and swelling.  She notes that she has been walking daily in order to stretch the leg.  She reports that since she began taking Eliquis she has had no nosebleeds, bruising, or dark bowel movements.  She does note however an increase in fatigue and a concerning blurring of her vision.  She also endorses having some brown spots on her sclera.  She notes that she does not have periods as result of a tubal ligation and vaginal ablation, but no other overt sources of bleeding.  A full 10 point ROS is listed below.  MEDICAL HISTORY:  Past Medical History:  Diagnosis Date  . Childhood asthma   . History of pulmonary embolism     SURGICAL HISTORY: Past Surgical History:  Procedure Laterality Date  . ENDOMETRIAL ABLATION    . PLACEMENT OF BREAST IMPLANTS  2012  . TUBAL LIGATION      SOCIAL HISTORY: Social History   Socioeconomic History  . Marital status: Divorced    Spouse name: Not on file  . Number of children: Not on file  . Years of education: Not on file  . Highest education level: Not on file  Occupational History  . Not on file  Tobacco Use  . Smoking status: Never Smoker  . Smokeless tobacco: Never Used  Substance and Sexual Activity  .  Alcohol use: No  . Drug use: No  . Sexual activity: Yes    Partners: Male    Birth control/protection: Surgical  Other Topics Concern  . Not on file  Social History Narrative   Single, lives with 11 year old daughter.   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   Moving to [facility] in 12/2019      Updated 11/2019   Social Determinants of Health   Financial Resource Strain:   . Difficulty of Paying Living Expenses: Not on file  Food Insecurity:    . Worried About Programme researcher, broadcasting/film/video in the Last Year: Not on file  . Ran Out of Food in the Last Year: Not on file  Transportation Needs:   . Lack of Transportation (Medical): Not on file  . Lack of Transportation (Non-Medical): Not on file  Physical Activity:   . Days of Exercise per Week: Not on file  . Minutes of Exercise per Session: Not on file  Stress:   . Feeling of Stress : Not on file  Social Connections:   . Frequency of Communication with Friends and Family: Not on file  . Frequency of Social Gatherings with Friends and Family: Not on file  . Attends Religious Services: Not on file  . Active Member of Clubs or Organizations: Not on file  . Attends Banker Meetings: Not on file  . Marital Status: Not on file  Intimate Partner Violence:   . Fear of Current or Ex-Partner: Not on file  . Emotionally Abused: Not on file  . Physically Abused: Not on file  . Sexually Abused: Not on file    FAMILY HISTORY: Family History  Problem Relation Age of Onset  . Hypertension Mother   . Hyperlipidemia Mother   . Ulcers Father   . Ulcers Brother   . Gout Brother   . Heart attack Paternal Grandmother   . Stroke Paternal Grandmother     ALLERGIES:  has No Known Allergies.  MEDICATIONS:  Current Outpatient Medications  Medication Sig Dispense Refill  . ELIQUIS 5 MG TABS tablet Take 1 tablet (5 mg total) by mouth 2 (two) times daily. 60 tablet 5   No current facility-administered medications for this visit.    REVIEW OF SYSTEMS:   Constitutional: ( - ) fevers, ( - )  chills , ( - ) night sweats Eyes: ( + ) blurriness of vision, ( - ) double vision, ( - ) watery eyes Ears, nose, mouth, throat, and face: ( - ) mucositis, ( - ) sore throat Respiratory: ( - ) cough, ( - ) dyspnea, ( - ) wheezes Cardiovascular: ( - ) palpitation, ( - ) chest discomfort, ( - ) lower extremity swelling Gastrointestinal:  ( - ) nausea, ( - ) heartburn, ( - ) change in bowel habits  Skin: ( - ) abnormal skin rashes Lymphatics: ( - ) new lymphadenopathy, ( - ) easy bruising Neurological: ( - ) numbness, ( - ) tingling, ( - ) new weaknesses Behavioral/Psych: ( - ) mood change, ( - ) new changes  All other systems were reviewed with the patient and are negative.  PHYSICAL EXAMINATION: ECOG PERFORMANCE STATUS: 0 - Asymptomatic  Vitals:   12/18/19 1419  BP: (!) 97/58  Pulse: 71  Resp: 16  Temp: (!) 97 F (36.1 C)  SpO2: 100%   Filed Weights   12/18/19 1419  Weight: 157 lb 4.8 oz (71.4 kg)    GENERAL: well  appearing young female in NAD  SKIN: skin color, texture, turgor are normal, no rashes or significant lesions EYES: conjunctiva are pink and non-injected, sclera clear LUNGS: clear to auscultation and percussion with normal breathing effort HEART: regular rate & rhythm and no murmurs and no lower extremity edema ABDOMEN: soft, non-tender, non-distended, normal bowel sounds Musculoskeletal: no cyanosis of digits and no clubbing  PSYCH: alert & oriented x 3, fluent speech NEURO: no focal motor/sensory deficits  LABORATORY DATA:  I have reviewed the data as listed Lab Results  Component Value Date   WBC 9.2 10/14/2018   HGB 12.1 10/14/2018   HCT 36.4 10/14/2018   MCV 90 10/14/2018   PLT 207 10/14/2018   NEUTROABS 5.7 10/14/2018    PATHOLOGY: None to review.   RADIOGRAPHIC STUDIES: None relevant to review.  ASSESSMENT & PLAN Sara Allen 44 y.o. female with medical history significant for recurrent VTEs who presents for recommendations regarding anticoagulation.  Discussion with the patient and review of the prior records it is apparent that the patient has recurrent DVTs without any clear hypercoagulable state.  Each and every one of the patient's DVTs can be explained by a provoking event including OCPs, pregnancy, and prolonged travel.  Due to her increased risk of DVTs we have discussed short-term versus lifelong anticoagulation, and the  patient was agreeable to considering lifelong anticoagulation.  Short-term treatment could be a reasonable option however in the setting of 3 DVTs I think it is not unreasonable to continue with a lifelong low-dose apixaban therapy.  At this time no further hypercoagulation work-up is indicated other than a check of the antiphospholipid antibodies.  A full work-up was conducted on 11/25/2008 at [facility] which was negative for any clear hypercoagulable state.  # Recurrent VTEs --patient has 3 separate episodes of DVT dating back to 2001 and all had a provoking factor --most recent RLE DVT occurred on 11/26/2019 in the setting of a prolong car ride to [facility]. --she had a hypercoagulation workup in 11/2008 at [facility] which was unrevealing  -- she is currently on eliquis 5mg  BID for treatment dose anticoagulation. Recommend continuing this x 6 months total with transition to 2.5 mg BID in June 2021.  --after discussion with the patient we determined that lifelong anticoagulation was the preferred route forward given her numerous DVTs. Of note, all of these were provoked and lifelong anticoagulation is technically not mandated. --RTC in approximately 5 months time to discuss transition to lower dose apixaban.  #Ocular Changes --blurriness of vision is not a known side effect of eliquis, though ocular hemorrhages can occur. Recommend referral to ophthalmology to further assess.   Orders Placed This Encounter  Procedures  . CBC with Differential (Cancer Center Only)    Standing Status:   Future    Number of Occurrences:   1    Standing Expiration Date:   12/17/2020  . CMP (Cancer Center only)    Standing Status:   Future    Number of Occurrences:   1    Standing Expiration Date:   12/17/2020  . Lupus anticoagulant panel*    Standing Status:   Future    Number of Occurrences:   1    Standing Expiration Date:   12/17/2020  . Cardiolipin antibodies, IgG, IgM, IgA*    Standing Status:   Future    Number of  Occurrences:   1    Standing Expiration Date:   12/17/2020  . Beta-2-glycoprotein i abs, IgG/M/A    Standing  Status:   Future    Number of Occurrences:   1    Standing Expiration Date:   12/17/2020    All questions were answered. The patient knows to call the clinic with any problems, questions or concerns.  A total of more than 60 minutes were spent on this encounter and over half of that time was spent on counseling and coordination of care as outlined above.   Ledell Peoples, MD Department of Hematology/Oncology [place] at [facility] Phone: [phone] Pager: [phone] Email: [email or web address]  12/18/2019 3:47 PM

## 2019-12-18 ENCOUNTER — Inpatient Hospital Stay: Payer: 59

## 2019-12-18 ENCOUNTER — Inpatient Hospital Stay: Payer: 59 | Attending: Hematology and Oncology | Admitting: Hematology and Oncology

## 2019-12-18 ENCOUNTER — Other Ambulatory Visit: Payer: Self-pay

## 2019-12-18 VITALS — BP 97/58 | HR 71 | Temp 97.0°F | Resp 16 | Ht 66.0 in | Wt 157.3 lb

## 2019-12-18 DIAGNOSIS — Z86711 Personal history of pulmonary embolism: Secondary | ICD-10-CM | POA: Diagnosis not present

## 2019-12-18 DIAGNOSIS — Z7901 Long term (current) use of anticoagulants: Secondary | ICD-10-CM

## 2019-12-18 DIAGNOSIS — I82401 Acute embolism and thrombosis of unspecified deep veins of right lower extremity: Secondary | ICD-10-CM

## 2019-12-18 DIAGNOSIS — Z86718 Personal history of other venous thrombosis and embolism: Secondary | ICD-10-CM | POA: Insufficient documentation

## 2019-12-18 LAB — CBC WITH DIFFERENTIAL (CANCER CENTER ONLY)
Abs Immature Granulocytes: 0.01 10*3/uL (ref 0.00–0.07)
Basophils Absolute: 0.1 10*3/uL (ref 0.0–0.1)
Basophils Relative: 1 %
Eosinophils Absolute: 0.2 10*3/uL (ref 0.0–0.5)
Eosinophils Relative: 3 %
HCT: 38.4 % (ref 36.0–46.0)
Hemoglobin: 12.4 g/dL (ref 12.0–15.0)
Immature Granulocytes: 0 %
Lymphocytes Relative: 38 %
Lymphs Abs: 2.8 10*3/uL (ref 0.7–4.0)
MCH: 29.9 pg (ref 26.0–34.0)
MCHC: 32.3 g/dL (ref 30.0–36.0)
MCV: 92.5 fL (ref 80.0–100.0)
Monocytes Absolute: 0.6 10*3/uL (ref 0.1–1.0)
Monocytes Relative: 8 %
Neutro Abs: 3.7 10*3/uL (ref 1.7–7.7)
Neutrophils Relative %: 50 %
Platelet Count: 150 10*3/uL (ref 150–400)
RBC: 4.15 MIL/uL (ref 3.87–5.11)
RDW: 12.1 % (ref 11.5–15.5)
WBC Count: 7.4 10*3/uL (ref 4.0–10.5)
nRBC: 0 % (ref 0.0–0.2)

## 2019-12-18 LAB — CMP (CANCER CENTER ONLY)
ALT: 12 U/L (ref 0–44)
AST: 18 U/L (ref 15–41)
Albumin: 4.1 g/dL (ref 3.5–5.0)
Alkaline Phosphatase: 58 U/L (ref 38–126)
Anion gap: 13 (ref 5–15)
BUN: 13 mg/dL (ref 6–20)
CO2: 22 mmol/L (ref 22–32)
Calcium: 8.7 mg/dL — ABNORMAL LOW (ref 8.9–10.3)
Chloride: 103 mmol/L (ref 98–111)
Creatinine: 0.99 mg/dL (ref 0.44–1.00)
GFR, Est AFR Am: >60 mL/min (ref >60)
GFR, Estimated: >60 mL/min (ref >60)
Glucose, Bld: 91 mg/dL (ref 70–99)
Potassium: 3.7 mmol/L (ref 3.5–5.1)
Sodium: 138 mmol/L (ref 135–145)
Total Bilirubin: 0.3 mg/dL (ref 0.3–1.2)
Total Protein: 7.6 g/dL (ref 6.5–8.1)

## 2019-12-19 LAB — LUPUS ANTICOAGULANT PANEL
DRVVT: 34.6 s (ref 0.0–47.0)
PTT Lupus Anticoagulant: 33.2 s (ref 0.0–51.9)

## 2019-12-20 LAB — CARDIOLIPIN ANTIBODIES, IGG, IGM, IGA
Anticardiolipin IgA: <9 APL U/mL (ref 0–11)
Anticardiolipin IgG: <9 GPL U/mL (ref 0–14)
Anticardiolipin IgM: 12 MPL U/mL (ref 0–12)

## 2019-12-20 LAB — BETA-2-GLYCOPROTEIN I ABS, IGG/M/A
Beta-2 Glyco I IgG: <9 GPI IgG units (ref 0–20)
Beta-2-Glycoprotein I IgA: <9 GPI IgA units (ref 0–25)
Beta-2-Glycoprotein I IgM: 9 GPI IgM units (ref 0–32)

## 2019-12-21 ENCOUNTER — Telehealth: Payer: Self-pay | Admitting: *Deleted

## 2019-12-21 NOTE — Telephone Encounter (Signed)
TCT patient regarding lab results from 12/18/19. Spoke with patient. Informed her that her labs were within normal limits.  Advised that we would see her back in 4-5 months to discuss transitioning to a lower dose. Pt wants Dr. Leonides Schanz to know that her grandmother and an aunt on her mother's side have both been on Xarelto . Her grandmother died at the age of 35, her aunt is still alive.

## 2019-12-21 NOTE — Telephone Encounter (Signed)
-----   Message from Jaci Standard, MD sent at 12/20/2019  6:06 PM EST ----- Please let Sara Allen know that her labs look fine. Her Hgb, Kidney and liver function are excellent. Continue to take the Eliquis as prescribed and we will follow up with her in about 4-5 months to discuss transitioning to a lower dose.  Azucena Freed  ----- Message ----- From: Leory Plowman, [facility] [place] Sent: 12/18/2019   4:07 PM EST To: Jaci Standard, MD

## 2019-12-29 ENCOUNTER — Telehealth: Payer: Self-pay | Admitting: *Deleted

## 2019-12-29 NOTE — Telephone Encounter (Signed)
TCT patient to inform her that I made an appt for her @ [facility] as Dr. Leonides Schanz had made a referral for opthalmology d/t her blurred vision. Spoke with her. Her appt is 01/03/20 at 10:30 am.  Pt voiced understanding. Dr. Derek Mound latest office note fax'd to [facility].

## 2020-01-02 ENCOUNTER — Telehealth: Payer: Self-pay | Admitting: *Deleted

## 2020-01-02 NOTE — Telephone Encounter (Signed)
Sara Allen called regarding refilling her eliquis. States she is having severe constipation. Taking miralax, drinking a lot of fluids and eating beans. She finally had to use an enema for some relief- only helped for 1 day. She is now on a liquid diet especially smoothies to keep from getting constipated.   She runs out of eliquis Tuesday night. Needs to get refill Wednesday morning, but wants to know if Dr Leonides Schanz can switch her to another blood thinner.

## 2020-01-03 ENCOUNTER — Other Ambulatory Visit: Payer: Self-pay | Admitting: Hematology and Oncology

## 2020-01-03 MED ORDER — RIVAROXABAN 15 MG PO TABS: 15.0000 mg | ORAL_TABLET | Freq: Two times a day (BID) | ORAL | 0 refills | Status: DC

## 2020-01-03 MED ORDER — RIVAROXABAN 20 MG PO TABS: 20.0000 mg | ORAL_TABLET | Freq: Every day | ORAL | 3 refills | Status: DC

## 2020-04-23 NOTE — Patient Instructions (Addendum)
Go to [place] for your chest X ray.    Preventive Care 50-44 Years Old, Female Preventive care refers to visits with your health care provider and lifestyle choices that can promote health and wellness. This includes:  A yearly physical exam. This may also be called an annual well check.  Regular dental visits and eye exams.  Immunizations.  Screening for certain conditions.  Healthy lifestyle choices, such as eating a healthy diet, getting regular exercise, not using drugs or products that contain nicotine and tobacco, and limiting alcohol use. What can I expect for my preventive care visit? Physical exam Your health care provider will check your:  Height and weight. This may be used to calculate body mass index (BMI), which tells if you are at a healthy weight.  Heart rate and blood pressure.  Skin for abnormal spots. Counseling Your health care provider may ask you questions about your:  Alcohol, tobacco, and drug use.  Emotional well-being.  Home and relationship well-being.  Sexual activity.  Eating habits.  Work and work Statistician.  Method of birth control.  Menstrual cycle.  Pregnancy history. What immunizations do I need?  Influenza (flu) vaccine  This is recommended every year. Tetanus, diphtheria, and pertussis (Tdap) vaccine  You may need a Td booster every 10 years. Varicella (chickenpox) vaccine  You may need this if you have not been vaccinated. Zoster (shingles) vaccine  You may need this after age 30. Measles, mumps, and rubella (MMR) vaccine  You may need at least one dose of MMR if you were born in 1957 or later. You may also need a second dose. Pneumococcal conjugate (PCV13) vaccine  You may need this if you have certain conditions and were not previously vaccinated. Pneumococcal polysaccharide (PPSV23) vaccine  You may need one or two doses if you smoke cigarettes or if you have certain conditions. Meningococcal  conjugate (MenACWY) vaccine  You may need this if you have certain conditions. Hepatitis A vaccine  You may need this if you have certain conditions or if you travel or work in places where you may be exposed to hepatitis A. Hepatitis B vaccine  You may need this if you have certain conditions or if you travel or work in places where you may be exposed to hepatitis B. Haemophilus influenzae type b (Hib) vaccine  You may need this if you have certain conditions. Human papillomavirus (HPV) vaccine  If recommended by your health care provider, you may need three doses over 6 months. You may receive vaccines as individual doses or as more than one vaccine together in one shot (combination vaccines). Talk with your health care provider about the risks and benefits of combination vaccines. What tests do I need? Blood tests  Lipid and cholesterol levels. These may be checked every 5 years, or more frequently if you are over 70 years old.  Hepatitis C test.  Hepatitis B test. Screening  Lung cancer screening. You may have this screening every year starting at age 80 if you have a 30-pack-year history of smoking and currently smoke or have quit within the past 15 years.  Colorectal cancer screening. All adults should have this screening starting at age 49 and continuing until age 56. Your health care provider may recommend screening at age 13 if you are at increased risk. You will have tests every 1-10 years, depending on your results and the type of screening test.  Diabetes screening. This is done by checking your blood sugar (glucose) after  you have not eaten for a while (fasting). You may have this done every 1-3 years.  Mammogram. This may be done every 1-2 years. Talk with your health care provider about when you should start having regular mammograms. This may depend on whether you have a family history of breast cancer.  BRCA-related cancer screening. This may be done if you have a  family history of breast, ovarian, tubal, or peritoneal cancers.  Pelvic exam and Pap test. This may be done every 3 years starting at age 20. Starting at age 81, this may be done every 5 years if you have a Pap test in combination with an HPV test. Other tests  Sexually transmitted disease (STD) testing.  Bone density scan. This is done to screen for osteoporosis. You may have this scan if you are at high risk for osteoporosis. Follow these instructions at home: Eating and drinking  Eat a diet that includes fresh fruits and vegetables, whole grains, lean protein, and low-fat dairy.  Take vitamin and mineral supplements as recommended by your health care provider.  Do not drink alcohol if: ? Your health care provider tells you not to drink. ? You are pregnant, may be pregnant, or are planning to become pregnant.  If you drink alcohol: ? Limit how much you have to 0-1 drink a day. ? Be aware of how much alcohol is in your drink. In the [place] one drink equals one 12 oz bottle of beer (355 mL), one 5 oz glass of wine (148 mL), or one 1 oz glass of hard liquor (44 mL). Lifestyle  Take daily care of your teeth and gums.  Stay active. Exercise for at least 30 minutes on 5 or more days each week.  Do not use any products that contain nicotine or tobacco, such as cigarettes, e-cigarettes, and chewing tobacco. If you need help quitting, ask your health care provider.  If you are sexually active, practice safe sex. Use a condom or other form of birth control (contraception) in order to prevent pregnancy and STIs (sexually transmitted infections).  If told by your health care provider, take low-dose aspirin daily starting at age 75. What's next?  Visit your health care provider once a year for a well check visit.  Ask your health care provider how often you should have your eyes and teeth checked.  Stay up to date on all vaccines. This information is not intended to replace advice given  to you by your health care provider. Make sure you discuss any questions you have with your health care provider. Document Revised: 08/04/2018 Document Reviewed: 08/04/2018 Elsevier Patient Education  2020 [employer].

## 2020-04-23 NOTE — Progress Notes (Signed)
Subjective:    Patient ID: Sara Allen, female    DOB: [birth year], 44 y.o.   MRN: [id number]  HPI Chief Complaint  Patient presents with  . cpe    cpe, blood clot back in december, sees obgyn, ate a peice of apple   She is here for a complete physical exam.  Other providers: OB/GYN- Dr. Allena Katz in [facility]  Hematologist- Dr. Leonides Schanz Other providers:   History of recurrent DVT and PE.  She also has concerns regarding fatigue and a 5 month history of shortness of breath with activity since starting on Xarelto. Denies bleeding.  She is being followed by Dr. Leonides Schanz. Has an appointment in June.   She had an unprovoked DVT in December 2020. Prior she had 2 other DVTs and PEs.    Now on Xarelto. States she had blurred vision with Eliquis.   Complains of fatigue since January since diagnosed with her 3rd blood clot. She is concerned about not being able to do her usual ADLs.  Working from home. Normally her job requires her to travel and she is concerned that she may have to start traveling again soon for work. She is not sure she will have the energy and also concerned about Covid.    Complains of shortness of breath when walking up the steps or being more active. No chest pain, palpitations, coughing or wheezing.   States she was told she may have lung scarring and pulmonary HTN. Would like to pursue seeing a lung specialist. Will discuss this with Dr. Leonides Schanz.   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 States triggers were always exercise induced.    Social history: Lives with her daughter, works as a Soil scientist.  Denies smoking, drinking alcohol, drug use  Diet: healthy  Excerise: working out 2-3 times per week. Uses elliptical.   Immunizations: Tdap more than 10 years ago.  She received both Covid vaccines. Last one March 30th. Pfizer   Health maintenance:  Mammogram: reports UTD with OB/GYN Colonoscopy: never  Last  Gynecological Exam: reports UTD with OB/GYN Last Menstrual cycle: none since ablation. Also had a tubal.    Depression screen [facility] 2/9 04/24/2020 10/14/2018 10/05/2017  Decreased Interest 0 0 0  Down, Depressed, Hopeless 0 0 0  PHQ - 2 Score 0 0 0     Wears seatbelt always, smoke detectors in home and functioning, does not text while driving and feels safe in home environment.   Reviewed allergies, medications, past medical, surgical, family, and social history.   Review of Systems Review of Systems Constitutional: -fever, -chills, -sweats, -unexpected weight change,-+fatigue ENT: -runny nose, -ear pain, -sore throat Cardiology:  -chest pain, -palpitations, -edema Respiratory: -cough, + shortness of breath, -wheezing Gastroenterology: -abdominal pain, -nausea, -vomiting, -diarrhea, -constipation  Hematology: -bleeding or bruising problems Musculoskeletal: -arthralgias, -myalgias, -joint swelling, -back pain Ophthalmology: -vision changes Urology: -dysuria, -difficulty urinating, -hematuria, -urinary frequency, -urgency Neurology: -headache, -weakness, -tingling, -numbness       Objective:   Physical Exam BP 120/70   Pulse 68   Ht 5' 5.75" (1.67 m)   Wt 152 lb 6.4 oz (69.1 kg)   BMI 24.79 kg/m   General Appearance:    Alert, cooperative, no distress, appears stated age  Head:    Normocephalic, without obvious abnormality, atraumatic  Eyes:    PERRL, conjunctiva/corneas clear, EOM's intact  Ears:    Normal TM's and external ear canals  Nose:   Mask in  place   Throat:   Mask in place   Neck:   Supple, no lymphadenopathy;  thyroid:  no   enlargement/tenderness/nodules; no JVD  Back:    Spine nontender, no curvature, ROM normal, no CVA     tenderness  Lungs:     Clear to auscultation bilaterally without wheezes, rales or     ronchi; respirations unlabored  Chest Wall:    No tenderness or deformity   Heart:    Regular rate and rhythm, S1 and S2 normal, no murmur, rub   or  gallop  Breast Exam:    OB/GYN   Abdomen:     Soft, non-tender, nondistended, normoactive bowel sounds,    no masses, no hepatosplenomegaly  Genitalia:    OB/GYN     Extremities:   No clubbing, cyanosis or edema  Pulses:   2+ and symmetric all extremities  Skin:   Skin color, texture, turgor normal, no rashes or lesions  Lymph nodes:   Cervical, supraclavicular, and axillary nodes normal  Neurologic:   CNII-XII intact, normal strength, sensation and gait; reflexes 2+ and symmetric throughout          Psych:   Normal mood, affect, hygiene and grooming.         Assessment & Plan:  Routine general medical examination at a health care facility - Plan: CBC with Differential/Platelet, Comprehensive metabolic panel, TSH, T4, free, T3, Lipid panel -Here today for a fasting CPE.  Reviewed preventive health care and she sees her OB/GYN.  Reports being up-to-date with mammogram and Pap smear.  She appears to be taking good care of herself and recommend she continue with healthy diet and exercise as tolerated.  Immunizations reviewed and updated.  Discussed safety and health promotion.  Elevated LDL cholesterol level - Plan: Lipid panel -Counseling on healthy diet and exercise.  Recurrent deep vein thrombosis (DVT) ([place]) -She is being followed by hematology and is on Xarelto without any obvious side effects.  History of pulmonary embolism - Plan: DG Chest 2 View -No sign of PE or any red flag symptoms currently. She is on Xarelto.  Fatigue, unspecified type - Plan: CBC with Differential/Platelet, Comprehensive metabolic panel, TSH, T4, free, T3, VITAMIN D 25 Hydroxy (Vit-D Deficiency, Fractures), Vitamin B12  DOE (dyspnea on exertion) - Plan: DG Chest 2 View -This has been intermittent and ongoing since January.  She is on Xarelto.  I will send her for chest x-ray.  We discussed the possibility of a referral to pulmonology for PFTs and further evaluation.  History of asthma without any issues  since age 3.  She plans to discuss pulmonology referral with her hematologist next month.  Need for Tdap vaccination - Plan: Tdap vaccine greater than or equal to 7yo IM Counseling done on all components of the vaccine.

## 2020-04-24 ENCOUNTER — Encounter: Payer: Self-pay | Admitting: Family Medicine

## 2020-04-24 ENCOUNTER — Other Ambulatory Visit: Payer: Self-pay

## 2020-04-24 ENCOUNTER — Ambulatory Visit (INDEPENDENT_AMBULATORY_CARE_PROVIDER_SITE_OTHER): Payer: 59 | Admitting: Family Medicine

## 2020-04-24 VITALS — BP 120/70 | HR 68 | Ht 65.75 in | Wt 152.4 lb

## 2020-04-24 DIAGNOSIS — I82409 Acute embolism and thrombosis of unspecified deep veins of unspecified lower extremity: Secondary | ICD-10-CM | POA: Diagnosis not present

## 2020-04-24 DIAGNOSIS — Z86711 Personal history of pulmonary embolism: Secondary | ICD-10-CM

## 2020-04-24 DIAGNOSIS — Z0000 Encounter for general adult medical examination without abnormal findings: Secondary | ICD-10-CM | POA: Diagnosis not present

## 2020-04-24 DIAGNOSIS — R0600 Dyspnea, unspecified: Secondary | ICD-10-CM

## 2020-04-24 DIAGNOSIS — R5383 Other fatigue: Secondary | ICD-10-CM

## 2020-04-24 DIAGNOSIS — Z23 Encounter for immunization: Secondary | ICD-10-CM | POA: Diagnosis not present

## 2020-04-24 DIAGNOSIS — E7800 Pure hypercholesterolemia, unspecified: Secondary | ICD-10-CM

## 2020-04-24 DIAGNOSIS — R0609 Other forms of dyspnea: Secondary | ICD-10-CM

## 2020-04-24 HISTORY — DX: Acute embolism and thrombosis of unspecified deep veins of unspecified lower extremity: I82.409

## 2020-04-25 LAB — COMPREHENSIVE METABOLIC PANEL
ALT: 14 IU/L (ref 0–32)
AST: 25 IU/L (ref 0–40)
Albumin/Globulin Ratio: 1.5 (ref 1.2–2.2)
Albumin: 4.3 g/dL (ref 3.8–4.8)
Alkaline Phosphatase: 68 IU/L (ref 48–121)
BUN/Creatinine Ratio: 10 (ref 9–23)
BUN: 10 mg/dL (ref 6–24)
Bilirubin Total: 0.3 mg/dL (ref 0.0–1.2)
CO2: 20 mmol/L (ref 20–29)
Calcium: 9.3 mg/dL (ref 8.7–10.2)
Chloride: 104 mmol/L (ref 96–106)
Creatinine, Ser: 1.02 mg/dL — ABNORMAL HIGH (ref 0.57–1.00)
GFR calc Af Amer: 78 mL/min/{1.73_m2} (ref >59)
GFR calc non Af Amer: 68 mL/min/{1.73_m2} (ref >59)
Globulin, Total: 2.9 g/dL (ref 1.5–4.5)
Glucose: 85 mg/dL (ref 65–99)
Potassium: 4.6 mmol/L (ref 3.5–5.2)
Sodium: 137 mmol/L (ref 134–144)
Total Protein: 7.2 g/dL (ref 6.0–8.5)

## 2020-04-25 LAB — CBC WITH DIFFERENTIAL/PLATELET
Basophils Absolute: 0.1 10*3/uL (ref 0.0–0.2)
Basos: 1 %
EOS (ABSOLUTE): 0.1 10*3/uL (ref 0.0–0.4)
Eos: 2 %
Hematocrit: 37.2 % (ref 34.0–46.6)
Hemoglobin: 12.2 g/dL (ref 11.1–15.9)
Immature Grans (Abs): 0 10*3/uL (ref 0.0–0.1)
Immature Granulocytes: 0 %
Lymphocytes Absolute: 1.8 10*3/uL (ref 0.7–3.1)
Lymphs: 28 %
MCH: 30 pg (ref 26.6–33.0)
MCHC: 32.8 g/dL (ref 31.5–35.7)
MCV: 92 fL (ref 79–97)
Monocytes Absolute: 0.3 10*3/uL (ref 0.1–0.9)
Monocytes: 5 %
Neutrophils Absolute: 4.2 10*3/uL (ref 1.4–7.0)
Neutrophils: 64 %
Platelets: 161 10*3/uL (ref 150–450)
RBC: 4.06 x10E6/uL (ref 3.77–5.28)
RDW: 12.1 % (ref 11.7–15.4)
WBC: 6.5 10*3/uL (ref 3.4–10.8)

## 2020-04-25 LAB — VITAMIN D 25 HYDROXY (VIT D DEFICIENCY, FRACTURES): Vit D, 25-Hydroxy: 27.6 ng/mL — ABNORMAL LOW (ref 30.0–100.0)

## 2020-04-25 LAB — LIPID PANEL
Chol/HDL Ratio: 3 ratio (ref 0.0–4.4)
Cholesterol, Total: 209 mg/dL — ABNORMAL HIGH (ref 100–199)
HDL: 70 mg/dL (ref >39)
LDL Chol Calc (NIH): 128 mg/dL — ABNORMAL HIGH (ref 0–99)
Triglycerides: 59 mg/dL (ref 0–149)
VLDL Cholesterol Cal: 11 mg/dL (ref 5–40)

## 2020-04-25 LAB — VITAMIN B12: Vitamin B-12: 588 pg/mL (ref 232–1245)

## 2020-04-25 LAB — TSH: TSH: 2.31 u[IU]/mL (ref 0.450–4.500)

## 2020-04-25 LAB — T4, FREE: Free T4: 1.21 ng/dL (ref 0.82–1.77)

## 2020-04-25 LAB — T3: T3, Total: 100 ng/dL (ref 71–180)

## 2020-04-25 NOTE — Progress Notes (Signed)
Her vitamin D is a little low. I recommend adding 1,000 IUs of OTC vitamin D3 daily. Also, her LDL or bad cholesterol is elevated but fortunately her HDL or good cholesterol is in a great range. I do recommend limiting foods high in fat or cholesterol and getting physical activity as tolerated. Labs are ok otherwise.

## 2020-04-26 ENCOUNTER — Ambulatory Visit
Admission: RE | Admit: 2020-04-26 | Discharge: 2020-04-26 | Disposition: A | Discharge status: Home / Self Care | Payer: 59 | Source: Ambulatory Visit | Attending: Family Medicine | Admitting: Family Medicine

## 2020-04-26 DIAGNOSIS — R0609 Other forms of dyspnea: Secondary | ICD-10-CM

## 2020-04-26 DIAGNOSIS — Z86711 Personal history of pulmonary embolism: Secondary | ICD-10-CM

## 2020-04-26 NOTE — Progress Notes (Signed)
Her chest XR is normal.

## 2020-04-29 ENCOUNTER — Telehealth: Payer: Self-pay | Admitting: *Deleted

## 2020-04-29 ENCOUNTER — Other Ambulatory Visit: Payer: Self-pay | Admitting: Family Medicine

## 2020-04-29 DIAGNOSIS — R0609 Other forms of dyspnea: Secondary | ICD-10-CM

## 2020-04-29 DIAGNOSIS — Z86711 Personal history of pulmonary embolism: Secondary | ICD-10-CM

## 2020-04-29 DIAGNOSIS — Z7901 Long term (current) use of anticoagulants: Secondary | ICD-10-CM

## 2020-04-29 DIAGNOSIS — J45909 Unspecified asthma, uncomplicated: Secondary | ICD-10-CM

## 2020-04-29 NOTE — Progress Notes (Signed)
I discussed her Xray on the telephone and referred her to pulmonology.

## 2020-04-29 NOTE — Telephone Encounter (Signed)
error 

## 2020-05-16 ENCOUNTER — Other Ambulatory Visit: Payer: Self-pay | Admitting: Hematology and Oncology

## 2020-05-16 DIAGNOSIS — I82401 Acute embolism and thrombosis of unspecified deep veins of right lower extremity: Secondary | ICD-10-CM

## 2020-05-16 NOTE — Progress Notes (Signed)
[facility] Telephone:([phone]   [phone]  PROGRESS NOTE  Patient Care Team: Avanell Shackleton, NP-C as PCP - General (Family Medicine)  Hematological/Oncological History  # Recurrent Provoked VTEs 1) 2001: RLE DVT and PE while on OCPs. Treated x 1 year with coumadin 2) 11/2008: LLE DVT and Pulmonary embolism diagnosed during pregnancy. Treated x 1 year with coumadin. Hypercoag workup including APS antibodies, FVL, Prothrombin gene mutation, Protein C and Protein S levels reveal no clear etiology.  3) 11/28/2019: RLE DVT diagnosed in [place] after 7 hour car trip. Initiated treatment with apixaban. 4) 12/18/2019: establish care with Dr. Leonides Schanz  5) 01/03/2020: due to visual disturbance, patient transitioned to Xarelto.  6) 05/17/2020: clinic visit, decrease Xarelto down to maintenance dose of 10 mg PO daily.   Interval History:  Sara Allen 44 y.o. female with medical history significant for recurrent provoked VTEs who presents for a follow up visit. The patient's last visit was on 12/18/2019 at which time she established care. In the interim since the last visit she transitioned to Xarelto therapy due to visual disturbances and constipation.   On exam today Sara Allen notes that she has been tolerating Xarelto therapy well.  She reports that since she transitioned from Eliquis to Xarelto she has had no issues with vision problems and she no longer requires glasses.  She also notes that her digestion is back to normal and is "98% better".  She reports that she still does have some constipation but has adopted a IT trainer which is helped to improve this.    She notes that her main concern today is increase in fatigue and shortness of breath.  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  She also notes that  she is been training her daughter to play basketball and that she has not been able to keep up to the degree that she would like.  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  She is currently been referred to pulmonology for further evaluation management for the shortness of breath.  Additionally a fatigue work-up performed on 04/24/2020 found no clear etiology.  Otherwise Sara Allen denies having any issues with bleeding, bruising, or dark stools.  A full 10 point ROS is listed below.  MEDICAL HISTORY:  Past Medical History:  Diagnosis Date  . Childhood asthma   . History of pulmonary embolism   . 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 ENDOMETRIAL ABLATION    . PLACEMENT OF BREAST IMPLANTS  2012  . TUBAL LIGATION      SOCIAL HISTORY: Social History   Socioeconomic History  . Marital status: Divorced    Spouse name: Not on file  . Number of children: Not on file  . Years of education: Not on file  . Highest education level: Not on file  Occupational History  . Not on file  Tobacco Use  . Smoking status: Never Smoker  . Smokeless tobacco: Never Used  Vaping Use  . Vaping Use: Never used  Substance and Sexual Activity  . Alcohol use: No  . Drug use: No  . Sexual activity: Yes    Partners: Male    Birth control/protection:  Surgical  Other Topics Concern  . Not on file  Social History Narrative   Single, lives with 23 year old daughter.   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   Moving to [facility] in 12/2019      Updated 11/2019   Social Determinants of Health   Financial Resource Strain:   . Difficulty of Paying Living Expenses:   Food Insecurity:   . Worried About Programme researcher, broadcasting/film/video in the Last Year:   . Barista in the Last Year:   Transportation Needs:   . Automotive engineer (Medical):   Marland Kitchen Lack of Transportation (Non-Medical):   Physical Activity:   . Days of Exercise per Week:   . Minutes of Exercise per Session:   Stress:   . Feeling of Stress :   Social Connections:   . Frequency of Communication with Friends and Family:   . Frequency of Social Gatherings with Friends and Family:   . Attends Religious Services:   . Active Member of Clubs or Organizations:   . Attends Banker Meetings:   Marland Kitchen Marital Status:   Intimate Partner Violence:   . Fear of Current or Ex-Partner:   . Emotionally Abused:   Marland Kitchen Physically Abused:   . Sexually Abused:     FAMILY HISTORY: Family History  Problem Relation Age of Onset  . Hypertension Mother   . Hyperlipidemia Mother   . Ulcers Father   . Ulcers Brother   . Gout Brother   . Heart attack Paternal Grandmother   . Stroke Paternal Grandmother     ALLERGIES:  has No Known Allergies.  MEDICATIONS:  Current Outpatient Medications  Medication Sig Dispense Refill  . Adapalene 0.3 % gel Apply to face QHS, wash off QAM; start out 2 nights per week    . metroNIDAZOLE (METROCREAM) 0.75 % cream Apply 1 application topically 2 (two) times daily.    . rivaroxaban (XARELTO) 10 MG TABS tablet Take 1 tablet (10 mg total) by mouth daily. 90 tablet 3  . SULFACLEANSE 8/4 8-4 % SUSP Wash affected areas QD  as directed     No current facility-administered medications for this visit.    REVIEW OF SYSTEMS:   Constitutional: ( - ) fevers, ( - )  chills , ( - ) night sweats (+) fatigue Eyes: ( - ) blurriness of vision, ( - ) double vision, ( - ) watery eyes Ears, nose, mouth, throat, and face: ( - ) mucositis, ( - ) sore throat Respiratory: ( - ) cough, ( + ) dyspnea, ( - ) wheezes Cardiovascular: ( - ) palpitation, ( - ) chest discomfort, ( - ) lower extremity swelling Gastrointestinal:  ( - ) nausea, ( - ) heartburn, ( - ) change in bowel habits Skin: ( - ) abnormal skin rashes Lymphatics: ( - ) new  lymphadenopathy, ( - ) easy bruising Neurological: ( - ) numbness, ( - ) tingling, ( - ) new weaknesses Behavioral/Psych: ( - ) mood change, ( - ) new changes  All other systems were reviewed with the patient and are negative.  PHYSICAL EXAMINATION: ECOG PERFORMANCE STATUS: 0 - Asymptomatic  Vitals:   05/17/20 0838  BP: 111/70  Pulse: 65  Resp: 18  Temp: (!) 97.5 F (36.4 C)  SpO2: 100%   Filed Weights   05/17/20 0838  Weight: 149 lb 12.8 oz (67.9 kg)    GENERAL: well appearing middle aged [place] American female,  alert, no distress and comfortable SKIN: skin color, texture, turgor are normal, no rashes or significant lesions EYES: conjunctiva are pink and non-injected, sclera clear LUNGS: clear to auscultation and percussion with normal breathing effort HEART: regular rate & rhythm and no murmurs and no lower extremity edema Musculoskeletal: no cyanosis of digits and no clubbing  PSYCH: alert & oriented x 3, fluent speech NEURO: no focal motor/sensory deficits  LABORATORY DATA:  I have reviewed the data as listed CBC Latest Ref Rng & Units 05/17/2020 04/24/2020 12/18/2019  WBC 4.0 - 10.5 K/uL 6.6 6.5 7.4  Hemoglobin 12.0 - 15.0 g/dL 12.5 12.2 12.4  Hematocrit 36 - 46 % 38.6 37.2 38.4  Platelets 150 - 400 K/uL 162 161 150    CMP Latest Ref Rng & Units 05/17/2020 04/24/2020 12/18/2019  Glucose 70 - 99 mg/dL 92 85 91  BUN 6 - 20 mg/dL 8 10 13   Creatinine 0.44 - 1.00 mg/dL 0.89 1.02(H) 0.99  Sodium 135 - 145 mmol/L 140 137 138  Potassium 3.5 - 5.1 mmol/L 3.9 4.6 3.7  Chloride 98 - 111 mmol/L 106 104 103  CO2 22 - 32 mmol/L 23 20 22   Calcium 8.9 - 10.3 mg/dL 9.3 9.3 8.7(L)  Total Protein 6.5 - 8.1 g/dL 7.7 7.2 7.6  Total Bilirubin 0.3 - 1.2 mg/dL 0.3 0.3 0.3  Alkaline Phos 38 - 126 U/L 75 68 58  AST 15 - 41 U/L 19 25 18   ALT 0 - 44 U/L 14 14 12     RADIOGRAPHIC STUDIES: DG Chest 2 View  Result Date: 04/26/2020 CLINICAL DATA:  History of pulmonary embolism. Dyspnea on  exertion. Shortness of breath with activity for 5 months. EXAM: CHEST - 2 VIEW COMPARISON:  Report from prior chest radiograph 08/29/2009 (images unavailable). CT a chest 02/07/2011. FINDINGS: Heart size within normal limits. There is no appreciable airspace consolidation. No evidence of pleural effusion or pneumothorax. No acute bony abnormality identified. IMPRESSION: No active cardiopulmonary disease. Electronically Signed   By: Kellie Simmering DO   On: 04/26/2020 11:27    ASSESSMENT & PLAN Sara Allen 44 y.o. female with medical history significant for recurrent VTEs who presents for follow up.  On exam today Sara Allen notes that she has been tolerating anticoagulation therapy well with Xarelto 20 mg p.o. daily.  Technically she is completed a full course of anticoagulation given that she has been on therapy for 6 months following her last VTE.  We again discussed discontinuation versus maintenance dose of Xarelto.  Given her 3 prior VTE's (though all provoked) we were in agreement that she is prone to blood clots and therefore it would be appropriate to continue her on maintenance dose of Xarelto.  We will decrease the dose to 10 mg p.o. daily and she will start this immediately.  In terms of the symptoms she is experiencing she has been having issues with fatigue as well as shortness of breath.  I will order iron studies to assure that she is not iron deficient at this time.  Additionally she has had improvement in her visual symptoms with the start of Xarelto therapy the discontinuation of Eliquis.  We will plan to see her back in approximately 6 months time to assure that she is tolerating anticoagulation therapy well.  # Recurrent VTEs --patient has 3 separate episodes of DVT dating back to 2001 and all had a provoking factor, with the most recent RLE DVT occurred on 11/26/2019 in the setting of a prolong car ride  to [facility]. --she had a hypercoagulation workup in 11/2008 at [facility] which was  unrevealing  -- she is currently on Xarelto 20mg  PO daily and tolerating the medication well.  Today she was agreeable to moving to the maintenance dose of Xarelto at 10mg  PO daily.  --after discussion with the patient we determined that lifelong anticoagulation was the preferred route forward given her numerous DVTs. Of note, all of these were provoked and lifelong anticoagulation is technically not mandated. --RTC in 6 month to re-evaluate.  #Persistent Shortness of Breath #Fatigue --thorough fatigue workup performed on 04/24/2020 including nutritional evaluation and thyroid studies. No etiology identified --patient has upcoming pulmonology appointment on 05/31/2020.    #Ocular Changes --blurriness of vision is not a known side effect of eliquis, though ocular hemorrhages can occur.  --patient had a clinic visit with ophthalmology who found no evidence of intraocular bleeding  --resolved with transition to Xarelto.   Orders Placed This Encounter  Procedures  . Iron and TIBC    Standing Status:   Future    Number of Occurrences:   1    Standing Expiration Date:   05/17/2021  . Ferritin    Standing Status:   Future    Number of Occurrences:   1    Standing Expiration Date:   05/17/2021   All questions were answered. The patient knows to call the clinic with any problems, questions or concerns.  A total of more than 30 minutes were spent on this encounter and over half of that time was spent on counseling and coordination of care as outlined above.   07/17/2021, MD Department of Hematology/Oncology [facility] Phone: [phone] Pager: [phone] Email: [phone]  05/17/2020 11:12 AM   Jonny Ruiz, 07/17/2020, [place] MH, [place] R, Beyer[place] J, [place] H, [place], Cohen AT, [place] BL, [place] H, [place] MCS, [place], [place], [place], [place] B, Pap AF, [place], [place] P, [place] PS,  Prandoni P; [employer] CHOICE Investigators. Rivaroxaban or Aspirin for Extended Treatment of Venous Thromboembolism. [place] J Med. 2017 Mar 30;376(13):1211-1222.  -- Among patients with venous thromboembolism in equipoise for continued anticoagulation, the risk of a recurrent event was significantly lower with rivaroxaban at either a treatment dose (20 mg) or a prophylactic dose (10 mg) than with aspirin, without a significant increase in bleeding rates.

## 2020-05-17 ENCOUNTER — Other Ambulatory Visit: Payer: Self-pay

## 2020-05-17 ENCOUNTER — Telehealth: Payer: Self-pay

## 2020-05-17 ENCOUNTER — Encounter: Payer: Self-pay | Admitting: Hematology and Oncology

## 2020-05-17 ENCOUNTER — Inpatient Hospital Stay: Payer: 59

## 2020-05-17 ENCOUNTER — Inpatient Hospital Stay: Payer: 59 | Attending: Hematology and Oncology | Admitting: Hematology and Oncology

## 2020-05-17 VITALS — BP 111/70 | HR 65 | Temp 97.5°F | Resp 18 | Ht 65.75 in | Wt 149.8 lb

## 2020-05-17 DIAGNOSIS — R0602 Shortness of breath: Secondary | ICD-10-CM | POA: Diagnosis not present

## 2020-05-17 DIAGNOSIS — Z7901 Long term (current) use of anticoagulants: Secondary | ICD-10-CM | POA: Diagnosis not present

## 2020-05-17 DIAGNOSIS — H539 Unspecified visual disturbance: Secondary | ICD-10-CM | POA: Insufficient documentation

## 2020-05-17 DIAGNOSIS — Z86711 Personal history of pulmonary embolism: Secondary | ICD-10-CM | POA: Insufficient documentation

## 2020-05-17 DIAGNOSIS — I82401 Acute embolism and thrombosis of unspecified deep veins of right lower extremity: Secondary | ICD-10-CM

## 2020-05-17 DIAGNOSIS — R5383 Other fatigue: Secondary | ICD-10-CM | POA: Diagnosis not present

## 2020-05-17 DIAGNOSIS — Z86718 Personal history of other venous thrombosis and embolism: Secondary | ICD-10-CM | POA: Diagnosis not present

## 2020-05-17 DIAGNOSIS — K5900 Constipation, unspecified: Secondary | ICD-10-CM | POA: Insufficient documentation

## 2020-05-17 LAB — CBC WITH DIFFERENTIAL (CANCER CENTER ONLY)
Abs Immature Granulocytes: 0.01 10*3/uL (ref 0.00–0.07)
Basophils Absolute: 0 10*3/uL (ref 0.0–0.1)
Basophils Relative: 1 %
Eosinophils Absolute: 0.1 10*3/uL (ref 0.0–0.5)
Eosinophils Relative: 2 %
HCT: 38.6 % (ref 36.0–46.0)
Hemoglobin: 12.5 g/dL (ref 12.0–15.0)
Immature Granulocytes: 0 %
Lymphocytes Relative: 30 %
Lymphs Abs: 2 10*3/uL (ref 0.7–4.0)
MCH: 29.2 pg (ref 26.0–34.0)
MCHC: 32.4 g/dL (ref 30.0–36.0)
MCV: 90.2 fL (ref 80.0–100.0)
Monocytes Absolute: 0.4 10*3/uL (ref 0.1–1.0)
Monocytes Relative: 6 %
Neutro Abs: 4.1 10*3/uL (ref 1.7–7.7)
Neutrophils Relative %: 61 %
Platelet Count: 162 10*3/uL (ref 150–400)
RBC: 4.28 MIL/uL (ref 3.87–5.11)
RDW: 12.2 % (ref 11.5–15.5)
WBC Count: 6.6 10*3/uL (ref 4.0–10.5)
nRBC: 0 % (ref 0.0–0.2)

## 2020-05-17 LAB — CMP (CANCER CENTER ONLY)
ALT: 14 U/L (ref 0–44)
AST: 19 U/L (ref 15–41)
Albumin: 4 g/dL (ref 3.5–5.0)
Alkaline Phosphatase: 75 U/L (ref 38–126)
Anion gap: 11 (ref 5–15)
BUN: 8 mg/dL (ref 6–20)
CO2: 23 mmol/L (ref 22–32)
Calcium: 9.3 mg/dL (ref 8.9–10.3)
Chloride: 106 mmol/L (ref 98–111)
Creatinine: 0.89 mg/dL (ref 0.44–1.00)
GFR, Est AFR Am: >60 mL/min (ref >60)
GFR, Estimated: >60 mL/min (ref >60)
Glucose, Bld: 92 mg/dL (ref 70–99)
Potassium: 3.9 mmol/L (ref 3.5–5.1)
Sodium: 140 mmol/L (ref 135–145)
Total Bilirubin: 0.3 mg/dL (ref 0.3–1.2)
Total Protein: 7.7 g/dL (ref 6.5–8.1)

## 2020-05-17 LAB — FERRITIN: Ferritin: 51 ng/mL (ref 11–307)

## 2020-05-17 LAB — IRON AND TIBC
Iron: 36 ug/dL — ABNORMAL LOW (ref 41–142)
Saturation Ratios: 10 % — ABNORMAL LOW (ref 21–57)
TIBC: 350 ug/dL (ref 236–444)
UIBC: 314 ug/dL (ref 120–384)

## 2020-05-17 MED ORDER — RIVAROXABAN 10 MG PO TABS
10.0000 mg | ORAL_TABLET | Freq: Every day | ORAL | 3 refills | Status: AC
Start: 2020-05-17 — End: ?

## 2020-05-17 NOTE — Telephone Encounter (Signed)
TCT to pt. per Dr. Leonides Schanz regarding her low iron of 36 with normal range 41-142 , advised that he would like her to continue taking her oral iron (325 mg ferrous sulfate otc). She verbalized understanding and knows to call clinic with any questions or concerns.

## 2020-05-20 ENCOUNTER — Telehealth: Payer: Self-pay | Admitting: Hematology and Oncology

## 2020-05-20 NOTE — Telephone Encounter (Signed)
Scheduled per los. Called and spoke with patient. Confirmed appt 

## 2020-05-30 ENCOUNTER — Other Ambulatory Visit: Payer: Self-pay

## 2020-05-30 ENCOUNTER — Ambulatory Visit (INDEPENDENT_AMBULATORY_CARE_PROVIDER_SITE_OTHER): Payer: 59 | Admitting: Emergency Medicine

## 2020-05-30 ENCOUNTER — Encounter: Payer: Self-pay | Admitting: Emergency Medicine

## 2020-05-30 ENCOUNTER — Ambulatory Visit: Payer: 59

## 2020-05-30 VITALS — BP 110/70 | HR 76 | Ht 65.75 in | Wt 147.8 lb

## 2020-05-30 DIAGNOSIS — R0600 Dyspnea, unspecified: Secondary | ICD-10-CM | POA: Insufficient documentation

## 2020-05-30 DIAGNOSIS — Z86711 Personal history of pulmonary embolism: Secondary | ICD-10-CM

## 2020-05-30 DIAGNOSIS — R0602 Shortness of breath: Secondary | ICD-10-CM

## 2020-05-30 NOTE — Progress Notes (Signed)
Subjective:    Patient ID: Sara Allen, female    DOB: 1976/07/06, 44 y.o.   MRN: [id number]  HPI 44 year old never smoker with a history of childhood asthma.  She has experienced multiple lower extremity DVT and pulmonary embolism, all theoretically provoked, as below.  Underwent hypercoagulable work-up that included negative factor V Leiden, negative factor II, normal protein C and protein S levels, normal APS antibody testing.  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  She was formally a Division I athlete.  Chest x-ray 04/26/2020 reviewed by me traits, no pneumothorax, no cardiomegaly.  Right lower extremity DVT and PE 2001 while on oral contraception, treated with 1 year of Coumadin Left lower extremity DVT and pulmonary embolism 11/2008 during pregnancy Right lower extremity DVT 11/28/2019 following 7-hour car trip, started on apixaban Changed to Xarelto due to visual disturbance 01/03/2020   Review of Systems As per HPI   Past Medical History:  Diagnosis Date  . Childhood asthma   . History of pulmonary embolism   . Recurrent deep vein thrombosis (DVT) ([place]) 04/24/2020     Family History  Problem Relation Age of Onset  . Hypertension Mother   . Hyperlipidemia Mother   . Ulcers Father   . Ulcers Brother   . Gout Brother   . Heart attack Paternal Grandmother   . Stroke Paternal Grandmother      Social History   Socioeconomic History  . Marital status: Divorced    Spouse name: Not on file  . Number of children: Not on file  . Years of education: Not on file  . Highest education level: Not on file  Occupational History  . Not on file  Tobacco Use  . Smoking status: Never Smoker  . Smokeless tobacco: Never Used  Vaping Use  . Vaping Use: Never used  Substance and Sexual Activity  . Alcohol use: No  . Drug use: No  . Sexual activity: Yes    Partners:  Male    Birth control/protection: Surgical  Other Topics Concern  . Not on file  Social History Narrative   Single, lives with 84 year old daughter.   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   Moving to [facility] in 12/2019      Updated 11/2019   Social Determinants of Health   Financial Resource Strain:   . Difficulty of Paying Living Expenses:   Food Insecurity:   . Worried About Charity fundraiser in the Last Year:   . Arboriculturist in the Last Year:   Transportation Needs:   . Film/video editor (Medical):   Marland Kitchen Lack of Transportation (Non-Medical):   Physical Activity:   . Days of Exercise per Week:   . Minutes of Exercise per Session:   Stress:   . Feeling of Stress :   Social Connections:   . Frequency of Communication with Friends and Family:   . Frequency of Social Gatherings with Friends and Family:   . Attends Religious Services:   . Active Member of Clubs or Organizations:   . Attends Archivist Meetings:   Marland Kitchen Marital Status:   Intimate Partner Violence:   . Fear of Current or Ex-Partner:   . Emotionally Abused:   Marland Kitchen Physically Abused:   . Sexually Abused:      No Known Allergies   Outpatient Medications Prior to Visit  Medication Sig Dispense  Refill  . Adapalene 0.3 % gel Apply to face QHS, wash off QAM; start out 2 nights per week    . metroNIDAZOLE (METROCREAM) 0.75 % cream Apply 1 application topically 2 (two) times daily.    . rivaroxaban (XARELTO) 10 MG TABS tablet Take 1 tablet (10 mg total) by mouth daily. 90 tablet 3  . SULFACLEANSE 8/4 8-4 % SUSP Wash affected areas QD  as directed     No facility-administered medications prior to visit.        Objective:   Physical Exam  Vitals:   05/30/20 1536  BP: 110/70  Pulse: 76  SpO2: 100%  Weight: 147 lb 12.8 oz (67 kg)  Height: 5' 5.75" (1.67 m)   Gen: Pleasant, well-nourished, in no distress,  normal affect  ENT: No lesions,  mouth clear,  oropharynx  clear, no postnasal drip  Neck: No JVD, no stridor  Lungs: No use of accessory muscles, no crackles or wheezing on normal respiration, no wheeze on forced expiration  Cardiovascular: RRR, heart sounds normal, no murmur or gallops, no peripheral edema  Musculoskeletal: No deformities, no cyanosis or clubbing  Neuro: alert, awake, non focal  Skin: Warm, no lesions or rash      Assessment & Plan:  Dyspnea Given her history, need to rule out pulmonary hypertension, any evidence for chronic thromboembolic disease.  We will obtain echocardiogram and VQ scan.  Also has a history of asthma in her teens.  We will perform pulmonary function testing to evaluate for any evidence of airflow obstruction.  If her echocardiogram, V/Q and PFT are reassuring then this may reflect the expected deconditioning and loss of function associated with the acute PE.  If that is the case I would expect for her to be able to regain her cardiopulmonary conditioning as she recovers from the acute event.   We will perform a ventilation perfusion scan We will perform an echocardiogram We will perform pulmonary function testing Follow with Dr Delton Coombes next available after your testing to review together  Levy Pupa, MD, PhD 05/30/2020, 4:11 PM [facility] [phone] or if no answer [phone]

## 2020-05-30 NOTE — Assessment & Plan Note (Signed)
Given her history, need to rule out pulmonary hypertension, any evidence for chronic thromboembolic disease.  We will obtain echocardiogram and VQ scan.  Also has a history of asthma in her teens.  We will perform pulmonary function testing to evaluate for any evidence of airflow obstruction.  If her echocardiogram, V/Q and PFT are reassuring then this may reflect the expected deconditioning and loss of function associated with the acute PE.  If that is the case I would expect for her to be able to regain her cardiopulmonary conditioning as she recovers from the acute event.   We will perform a ventilation perfusion scan We will perform an echocardiogram We will perform pulmonary function testing Follow with Dr Delton Coombes next available after your testing to review together

## 2020-05-30 NOTE — Patient Instructions (Addendum)
We will perform a ventilation perfusion scan We will perform an echocardiogram We will perform pulmonary function testing Follow with Dr Delton Coombes next available after your testing to review together

## 2020-06-17 ENCOUNTER — Ambulatory Visit (HOSPITAL_COMMUNITY)
Admission: RE | Admit: 2020-06-17 | Discharge: 2020-06-17 | Disposition: A | Discharge status: Home / Self Care | Payer: 59 | Source: Ambulatory Visit | Attending: Emergency Medicine | Admitting: Emergency Medicine

## 2020-06-17 ENCOUNTER — Other Ambulatory Visit: Payer: Self-pay

## 2020-06-17 ENCOUNTER — Other Ambulatory Visit: Payer: Self-pay | Admitting: Emergency Medicine

## 2020-06-17 ENCOUNTER — Encounter (HOSPITAL_COMMUNITY)
Admission: RE | Admit: 2020-06-17 | Discharge: 2020-06-17 | Disposition: A | Discharge status: Home / Self Care | Payer: 59 | Source: Ambulatory Visit | Attending: Emergency Medicine | Admitting: Emergency Medicine

## 2020-06-17 DIAGNOSIS — Z86711 Personal history of pulmonary embolism: Secondary | ICD-10-CM | POA: Diagnosis not present

## 2020-06-17 MED ORDER — TECHNETIUM TO 99M ALBUMIN AGGREGATED
4.2000 | Freq: Once | INTRAVENOUS | Status: AC
  Administered 2020-06-17: 4.2 via INTRAVENOUS

## 2020-06-28 ENCOUNTER — Ambulatory Visit (HOSPITAL_COMMUNITY): Payer: 59 | Attending: Cardiology

## 2020-06-28 ENCOUNTER — Other Ambulatory Visit: Payer: Self-pay

## 2020-06-28 DIAGNOSIS — Z86711 Personal history of pulmonary embolism: Secondary | ICD-10-CM | POA: Insufficient documentation

## 2020-06-28 DIAGNOSIS — R0602 Shortness of breath: Secondary | ICD-10-CM

## 2020-06-28 LAB — ECHOCARDIOGRAM COMPLETE
Area-P 1/2: 3.17 cm2
MV M vel: 4.72 m/s
MV Peak grad: 89.1 mmHg
Radius: 0.3 cm
S' Lateral: 2.7 cm

## 2020-07-05 ENCOUNTER — Telehealth: Payer: Self-pay | Admitting: Emergency Medicine

## 2020-07-05 NOTE — Telephone Encounter (Signed)
Spoke with pt. She is requesting the results from her echo, VQ scan and CXR.  Dr. Delton Coombes - please advise. Thanks!

## 2020-07-05 NOTE — Telephone Encounter (Signed)
Please let her know that her chest x-ray, ventilation/perfusion scan, and echocardiogram are all normal.  Good news

## 2020-07-08 NOTE — Telephone Encounter (Signed)
Spoke with pt. She is aware of results. Nothing further was needed.  

## 2020-07-17 ENCOUNTER — Ambulatory Visit (INDEPENDENT_AMBULATORY_CARE_PROVIDER_SITE_OTHER): Payer: 59 | Admitting: Emergency Medicine

## 2020-07-17 ENCOUNTER — Other Ambulatory Visit: Payer: Self-pay

## 2020-07-17 ENCOUNTER — Encounter: Payer: Self-pay | Admitting: Emergency Medicine

## 2020-07-17 DIAGNOSIS — R0602 Shortness of breath: Secondary | ICD-10-CM | POA: Diagnosis not present

## 2020-07-17 LAB — PULMONARY FUNCTION TEST
DL/VA % pred: 118 %
DL/VA: 5.15 ml/min/mmHg/L
DLCO cor % pred: 97 %
DLCO cor: 22.64 ml/min/mmHg
DLCO unc % pred: 95 %
DLCO unc: 21.99 ml/min/mmHg
FEF 25-75 Post: 4.47 L/sec
FEF 25-75 Pre: 4.02 L/sec
FEF2575-%Change-Post: 10 %
FEF2575-%Pred-Post: 156 %
FEF2575-%Pred-Pre: 140 %
FEV1-%Change-Post: 2 %
FEV1-%Pred-Post: 120 %
FEV1-%Pred-Pre: 117 %
FEV1-Post: 3.21 L
FEV1-Pre: 3.13 L
FEV1FVC-%Change-Post: 0 %
FEV1FVC-%Pred-Pre: 105 %
FEV6-%Change-Post: 1 %
FEV6-%Pred-Post: 114 %
FEV6-%Pred-Pre: 112 %
FEV6-Post: 3.65 L
FEV6-Pre: 3.58 L
FEV6FVC-%Pred-Post: 102 %
FEV6FVC-%Pred-Pre: 102 %
FVC-%Change-Post: 1 %
FVC-%Pred-Post: 111 %
FVC-%Pred-Pre: 109 %
FVC-Post: 3.65 L
FVC-Pre: 3.58 L
Post FEV1/FVC ratio: 88 %
Post FEV6/FVC ratio: 100 %
Pre FEV1/FVC ratio: 88 %
Pre FEV6/FVC Ratio: 100 %
RV % pred: 105 %
RV: 1.84 L
TLC % pred: 99 %
TLC: 5.33 L

## 2020-07-17 NOTE — Progress Notes (Signed)
   Subjective:    Patient ID: Sara Allen, female    DOB: 07/20/1976, 44 y.o.   MRN: [id number]  HPI 44 year old never smoker with a history of childhood asthma.  She has experienced multiple lower extremity DVT and pulmonary embolism, all theoretically provoked, as below.  Underwent hypercoagulable work-up that included negative factor V Leiden, negative factor II, normal protein C and protein S levels, normal APS antibody testing.  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  She was formally a Division I athlete.  Chest x-ray 04/26/2020 reviewed by me traits, no pneumothorax, no cardiomegaly.  Right lower extremity DVT and PE 2001 while on oral contraception, treated with 1 year of Coumadin Left lower extremity DVT and pulmonary embolism 11/2008 during pregnancy Right lower extremity DVT 11/28/2019 following 7-hour car trip, started on apixaban Changed to Xarelto due to visual disturbance 01/03/2020  ROV  07/17/20 -- this is a follow-up visit for 44 year old never smoker with a history of childhood asthma and VTE.  She is on chronic anticoagulation, currently Xarelto.  I saw her for exertional dyspnea and a decrease in her functional capacity.  We performed pulmonary function testing today that I have reviewed, shows normal airflows without a bronchodilator response, normal lung volumes and a normal diffusion capacity.  She also had a chest x-ray and a VQ scan on 06/17/2020 which I reviewed.  Both were reassuring. Echocardiogram 7/23 reviewed by me, shows normal Rv fxn and PAPs.    Review of Systems As per HPI     Objective:   Physical Exam  Vitals:   07/17/20 1126  BP: 120/68  Pulse: 66  Temp: (!) 97.2 F (36.2 C)  TempSrc: Temporal  SpO2: 100%  Weight: 151 lb 6.4 oz (68.7 kg)  Height: 5\' 6"  (1.676 m)   Gen: Pleasant, well-nourished, in no distress,  normal affect  ENT: No  lesions,  mouth clear,  oropharynx clear, no postnasal drip  Neck: No JVD, no stridor  Lungs: No use of accessory muscles, no crackles or wheezing on normal respiration, no wheeze on forced expiration  Cardiovascular: RRR, heart sounds normal, no murmur or gallops, no peripheral edema  Musculoskeletal: No deformities, no cyanosis or clubbing  Neuro: alert, awake, non focal  Skin: Warm, no lesions or rash      Assessment & Plan:  No problem-specific Assessment & Plan notes found for this encounter.  , MD, PhD 07/17/2020, 11:41 AM [facility] [phone] or if no answer [phone]

## 2020-07-17 NOTE — Assessment & Plan Note (Signed)
There is a broad differential diagnosis but all of her evaluation has been reassuring.  Pulmonary function testing does not show any recurrence of obstructive lung disease or restriction.  Her chest x-ray is reassuring.  Ventilation/perfusion scan is low probability for chronic normal mild disease.  Her echocardiogram does not show any evidence of LV dysfunction or pulmonary hypertension.  I believe her dyspnea largely relates to deconditioning and I have encouraged her to slowly increase her exercise.Your breathing tests, echocardiogram, chest x-ray and perfusion scan are all normal. This is good news! No barriers to carefully increasing your exercise and conditioning.  Follow with Dr Delton Coombes if needed for changes in your breathing.  COVID-19 vaccine up to date.  Please note that you are at increased risk for blood clots if you contract COVID-19 due to your medical history. This should be taken into account if you were to catch the disease.

## 2020-07-17 NOTE — Progress Notes (Signed)
PFT done today. 

## 2020-07-17 NOTE — Patient Instructions (Addendum)
Your breathing tests, echocardiogram, chest x-ray and perfusion scan are all normal. This is good news! No barriers to carefully increasing your exercise and conditioning.  Follow with Dr Delton Coombes if needed for changes in your breathing.  COVID-19 vaccine up to date.  Please note that you are at increased risk for blood clots if you contract COVID-19 due to your medical history. This should be taken into account if you were to catch the disease.

## 2020-07-18 ENCOUNTER — Ambulatory Visit: Payer: 59 | Admitting: Family Medicine

## 2020-11-14 ENCOUNTER — Telehealth: Payer: Self-pay | Admitting: Hematology and Oncology

## 2020-11-14 NOTE — Telephone Encounter (Signed)
Ms. Deady cld to r/s her appt w/Dr. Leonides Schanz to 12/21 at 830am w/labs at 8am.

## 2020-11-15 ENCOUNTER — Inpatient Hospital Stay: Payer: 59

## 2020-11-15 ENCOUNTER — Inpatient Hospital Stay: Payer: 59 | Admitting: Hematology and Oncology

## 2020-11-26 ENCOUNTER — Other Ambulatory Visit: Payer: Self-pay | Admitting: Hematology and Oncology

## 2020-11-26 DIAGNOSIS — I82401 Acute embolism and thrombosis of unspecified deep veins of right lower extremity: Secondary | ICD-10-CM

## 2020-11-26 NOTE — Progress Notes (Signed)
[facility] [phone]   [phone]  PROGRESS NOTE  Patient Care Team: Avanell Shackleton, NP-C as PCP - General (Family Medicine)  Hematological/Oncological History  # Recurrent Provoked VTEs 1) 2001: RLE DVT and PE while on OCPs. Treated x 1 year with coumadin 2) 11/2008: LLE DVT and Pulmonary embolism diagnosed during pregnancy. Treated x 1 year with coumadin. Hypercoag workup including APS antibodies, FVL, Prothrombin gene mutation, Protein C and Protein S levels reveal no clear etiology.  3) 11/28/2019: RLE DVT diagnosed in [place] after 7 hour car trip. Initiated treatment with apixaban. 4) 12/18/2019: establish care with Dr. Leonides Schanz  5) 01/03/2020: due to visual disturbance, patient transitioned to Xarelto.  6) 05/17/2020: clinic visit, decrease Xarelto down to maintenance dose of 10 mg PO daily.   Interval History:  Sara Allen 44 y.o. female with medical history significant for recurrent provoked VTEs who presents for a follow up visit. The patient's last visit was on 05/17/2020 at which time she established care. In the interim since the last visit she transitioned to Xarelto maitenance therapy 10mg  Po daily.   On exam today Sara Allen notes she has been well in the interim since her last visit.  She has been taking Xarelto 10 mg p.o. daily and has been tolerating it well with no bleeding, or bruising.  She notes that there has been nothing "abnormal".  She reports that she does occasionally have some problems with her right leg feeling numb and this is been a problem over the last 3 to 4 weeks.  She notes it does not occur all the time and typically occurs after she has been sitting or watching television.  She notes that she is not having any issues with chest pain, shortness of breath, swelling of her leg, or changes in her vision.  She reports that her bowels are moving normally.  She has had no other changes in her health since our last  visit.  She looks forward to traveling over the next several months with her daughter to look at colleges.  Her daughter is a .  A full 10 point ROS is listed below  MEDICAL HISTORY:  Past Medical History:  Diagnosis Date  . Childhood asthma   . History of pulmonary embolism   . 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 ENDOMETRIAL ABLATION    . PLACEMENT OF BREAST IMPLANTS  2012  . TUBAL LIGATION      SOCIAL HISTORY: Social History   Socioeconomic History  . Marital status: Divorced    Spouse name: Not on file  . Number of children: Not on file  . Years of education: Not on file  . Highest education level: Not on file  Occupational History  . Not on file  Tobacco Use  . Smoking status: Never Smoker  . Smokeless tobacco: Never Used  Vaping Use  . Vaping Use: Never used  Substance and Sexual Activity  . Alcohol use: No  . Drug use: No  . Sexual activity: Yes    Partners: Male    Birth control/protection: Surgical  Other Topics Concern  . Not on file  Social History Narrative   Single, lives with 86 year old daughter.   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   Moving to [facility] in 12/2019      Updated 11/2019   Social Determinants of Health   Financial  Resource Strain: Not on file  Food Insecurity: Not on file  Transportation Needs: Not on file  Physical Activity: Not on file  Stress: Not on file  Social Connections: Not on file  Intimate Partner Violence: Not on file    FAMILY HISTORY: Family History  Problem Relation Age of Onset  . Hypertension Mother   . Hyperlipidemia Mother   . Ulcers Father   . Ulcers Brother   . Gout Brother   . Heart attack Paternal Grandmother   . Stroke Paternal Grandmother     ALLERGIES:  has No Known Allergies.  MEDICATIONS:  Current Outpatient Medications  Medication Sig Dispense Refill  .  Adapalene 0.3 % gel Apply to face QHS, wash off QAM; start out 2 nights per week    . metroNIDAZOLE (METROCREAM) 0.75 % cream Apply 1 application topically 2 (two) times daily.    . rivaroxaban (XARELTO) 10 MG TABS tablet Take 1 tablet (10 mg total) by mouth daily. 90 tablet 3  . SULFACLEANSE 8/4 8-4 % SUSP Wash affected areas QD  as directed     No current facility-administered medications for this visit.    REVIEW OF SYSTEMS:   Constitutional: ( - ) fevers, ( - )  chills , ( - ) night sweats (+) fatigue Eyes: ( - ) blurriness of vision, ( - ) double vision, ( - ) watery eyes Ears, nose, mouth, throat, and face: ( - ) mucositis, ( - ) sore throat Respiratory: ( - ) cough, ( + ) dyspnea, ( - ) wheezes Cardiovascular: ( - ) palpitation, ( - ) chest discomfort, ( - ) lower extremity swelling Gastrointestinal:  ( - ) nausea, ( - ) heartburn, ( - ) change in bowel habits Skin: ( - ) abnormal skin rashes Lymphatics: ( - ) new lymphadenopathy, ( - ) easy bruising Neurological: ( - ) numbness, ( - ) tingling, ( - ) new weaknesses Behavioral/Psych: ( - ) mood change, ( - ) new changes  All other systems were reviewed with the patient and are negative.  PHYSICAL EXAMINATION: ECOG PERFORMANCE STATUS: 0 - Asymptomatic  Vitals:   11/27/20 0851  BP: 111/74  Pulse: 62  Resp: 16  Temp: 97.8 F (36.6 C)  SpO2: 100%   Filed Weights   11/27/20 0851  Weight: 149 lb 11.2 oz (67.9 kg)    GENERAL: well appearing middle aged [place] American female, alert, no distress and comfortable SKIN: skin color, texture, turgor are normal, no rashes or significant lesions EYES: conjunctiva are pink and non-injected, sclera clear LUNGS: clear to auscultation and percussion with normal breathing effort HEART: regular rate & rhythm and no murmurs and no lower extremity edema Musculoskeletal: no cyanosis of digits and no clubbing  PSYCH: alert & oriented x 3, fluent speech NEURO: no focal motor/sensory  deficits  LABORATORY DATA:  I have reviewed the data as listed CBC Latest Ref Rng & Units 11/27/2020 05/17/2020 04/24/2020  WBC 4.0 - 10.5 K/uL 6.7 6.6 6.5  Hemoglobin 12.0 - 15.0 g/dL 83.3 82.5 05.3  Hematocrit 36.0 - 46.0 % 37.9 38.6 37.2  Platelets 150 - 400 K/uL 187 162 161    CMP Latest Ref Rng & Units 11/27/2020 05/17/2020 04/24/2020  Glucose 70 - 99 mg/dL 84 92 85  BUN 6 - 20 mg/dL 13 8 10   Creatinine 0.44 - 1.00 mg/dL 9.76 7.34)  Sodium 135 - 145 mmol/L 138 140 137  Potassium 3.5 - 5.1 mmol/L 3.9 3.9 4.6  Chloride 98 - 111 mmol/L 105 106 104  CO2 22 - 32 mmol/L 24 23 20   Calcium 8.9 - 10.3 mg/dL 9.5 9.3 9.3  Total Protein 6.5 - 8.1 g/dL 7.9 7.7 7.2  Total Bilirubin 0.3 - 1.2 mg/dL 0.5 0.3 0.3  Alkaline Phos 38 - 126 U/L 65 75 68  AST 15 - 41 U/L 21 19 25   ALT 0 - 44 U/L 16 14 14     RADIOGRAPHIC STUDIES: No results found.  ASSESSMENT & PLAN Sara Allen 44 y.o. female with medical history significant for recurrent VTEs who presents for follow up.  On exam today Sara Allen notes that she has been tolerating anticoagulation therapy well with Xarelto 20 mg p.o. daily.  Technically she has completed a full course of anticoagulation given that she has been on therapy for 6 months following her last VTE.  We previously discussed discontinuation versus maintenance dose of Xarelto.  Given her 3 prior VTE's (though all provoked) we were in agreement that she is prone to blood clots and therefore it would be appropriate to continue her on maintenance dose of Xarelto.    On exam today Sara Allen notes she is doing well on 10 mg PO Xarelto.  We will plan to see her back in approximately 6 months time to assure that she is tolerating anticoagulation therapy well.  # Recurrent VTEs --patient has 3 separate episodes of DVT dating back to 2001 and all had a provoking factor, with the most recent RLE DVT occurred on 11/26/2019 in the setting of a prolong car ride to [facility]. --she  had a hypercoagulation workup in 11/2008 at [facility] which was unrevealing  -- she is currently on maintenance dose of Xarelto at 10mg  PO daily.  --after discussion with the patient we determined that lifelong anticoagulation was the preferred route forward given her numerous DVTs. Of note, all of these were provoked and lifelong anticoagulation is technically not mandated. --RTC in 6 month to re-evaluate.   #Ocular Changes --blurriness of vision is not a known side effect of eliquis, though ocular hemorrhages can occur.  --patient had a clinic visit with ophthalmology who found no evidence of intraocular bleeding  --resolved with transition to Xarelto.   No orders of the defined types were placed in this encounter.  All questions were answered. The patient knows to call the clinic with any problems, questions or concerns.  A total of more than 30 minutes were spent on this encounter and over half of that time was spent on counseling and coordination of care as outlined above.   [facility], MD Department of Hematology/Oncology [facility] Phone: [phone] Pager: [phone] Email: [email or web address]  11/27/2020 9:28 AM   [phone], [phone], [place] MH, [place] R, Beyer-[place] J, [place] H, [place], [place] AT, Tullos BL, [place] H, [place] MCS, [place], [place], [place], [place] B, Pap AF, [place], [place] P, [place] PS, Prandon[place] P; [employer]. Rivaroxaban or Aspirin for Extended Treatment of Venous Thromboembolism. [place] J Med. 2017 Mar 30;376(13):1211-1222.  -- Among patients with venous thromboembolism in equipoise for continued anticoagulation, the risk of a recurrent event was significantly lower with rivaroxaban at either a treatment dose (20 mg) or a prophylactic dose (10 mg) than with aspirin, without a significant increase in bleeding rates.

## 2020-11-27 ENCOUNTER — Inpatient Hospital Stay: Payer: 59 | Attending: Hematology and Oncology | Admitting: Hematology and Oncology

## 2020-11-27 ENCOUNTER — Other Ambulatory Visit: Payer: Self-pay

## 2020-11-27 ENCOUNTER — Inpatient Hospital Stay: Payer: 59

## 2020-11-27 ENCOUNTER — Encounter: Payer: Self-pay | Admitting: Hematology and Oncology

## 2020-11-27 VITALS — BP 111/74 | HR 62 | Temp 97.8°F | Resp 16 | Ht 66.0 in | Wt 149.7 lb

## 2020-11-27 DIAGNOSIS — Z8269 Family history of other diseases of the musculoskeletal system and connective tissue: Secondary | ICD-10-CM | POA: Insufficient documentation

## 2020-11-27 DIAGNOSIS — Z86718 Personal history of other venous thrombosis and embolism: Secondary | ICD-10-CM | POA: Diagnosis present

## 2020-11-27 DIAGNOSIS — Z79899 Other long term (current) drug therapy: Secondary | ICD-10-CM | POA: Insufficient documentation

## 2020-11-27 DIAGNOSIS — Z7901 Long term (current) use of anticoagulants: Secondary | ICD-10-CM | POA: Diagnosis not present

## 2020-11-27 DIAGNOSIS — R200 Anesthesia of skin: Secondary | ICD-10-CM | POA: Diagnosis not present

## 2020-11-27 DIAGNOSIS — Z823 Family history of stroke: Secondary | ICD-10-CM | POA: Diagnosis not present

## 2020-11-27 DIAGNOSIS — I82401 Acute embolism and thrombosis of unspecified deep veins of right lower extremity: Secondary | ICD-10-CM

## 2020-11-27 DIAGNOSIS — Z86711 Personal history of pulmonary embolism: Secondary | ICD-10-CM | POA: Insufficient documentation

## 2020-11-27 DIAGNOSIS — Z8349 Family history of other endocrine, nutritional and metabolic diseases: Secondary | ICD-10-CM | POA: Diagnosis not present

## 2020-11-27 DIAGNOSIS — Z8249 Family history of ischemic heart disease and other diseases of the circulatory system: Secondary | ICD-10-CM | POA: Insufficient documentation

## 2020-11-27 LAB — CMP (CANCER CENTER ONLY)
ALT: 16 U/L (ref 0–44)
AST: 21 U/L (ref 15–41)
Albumin: 4.1 g/dL (ref 3.5–5.0)
Alkaline Phosphatase: 65 U/L (ref 38–126)
Anion gap: 9 (ref 5–15)
BUN: 13 mg/dL (ref 6–20)
CO2: 24 mmol/L (ref 22–32)
Calcium: 9.5 mg/dL (ref 8.9–10.3)
Chloride: 105 mmol/L (ref 98–111)
Creatinine: 0.92 mg/dL (ref 0.44–1.00)
GFR, Estimated: >60 mL/min (ref >60)
Glucose, Bld: 84 mg/dL (ref 70–99)
Potassium: 3.9 mmol/L (ref 3.5–5.1)
Sodium: 138 mmol/L (ref 135–145)
Total Bilirubin: 0.5 mg/dL (ref 0.3–1.2)
Total Protein: 7.9 g/dL (ref 6.5–8.1)

## 2020-11-27 LAB — CBC WITH DIFFERENTIAL (CANCER CENTER ONLY)
Abs Immature Granulocytes: 0.01 10*3/uL (ref 0.00–0.07)
Basophils Absolute: 0 10*3/uL (ref 0.0–0.1)
Basophils Relative: 1 %
Eosinophils Absolute: 0.2 10*3/uL (ref 0.0–0.5)
Eosinophils Relative: 3 %
HCT: 37.9 % (ref 36.0–46.0)
Hemoglobin: 12.3 g/dL (ref 12.0–15.0)
Immature Granulocytes: 0 %
Lymphocytes Relative: 35 %
Lymphs Abs: 2.3 10*3/uL (ref 0.7–4.0)
MCH: 29.9 pg (ref 26.0–34.0)
MCHC: 32.5 g/dL (ref 30.0–36.0)
MCV: 92 fL (ref 80.0–100.0)
Monocytes Absolute: 0.4 10*3/uL (ref 0.1–1.0)
Monocytes Relative: 6 %
Neutro Abs: 3.7 10*3/uL (ref 1.7–7.7)
Neutrophils Relative %: 55 %
Platelet Count: 187 10*3/uL (ref 150–400)
RBC: 4.12 MIL/uL (ref 3.87–5.11)
RDW: 12.3 % (ref 11.5–15.5)
WBC Count: 6.7 10*3/uL (ref 4.0–10.5)
nRBC: 0 % (ref 0.0–0.2)

## 2020-12-02 ENCOUNTER — Telehealth: Payer: Self-pay | Admitting: Hematology and Oncology

## 2020-12-02 NOTE — Telephone Encounter (Signed)
Scheduled per 12/22 los. Called pt and left a msg

## 2020-12-11 ENCOUNTER — Telehealth: Payer: Self-pay | Admitting: Internal Medicine

## 2020-12-11 ENCOUNTER — Telehealth: Payer: Self-pay | Admitting: Family Medicine

## 2020-12-11 ENCOUNTER — Other Ambulatory Visit: Payer: Self-pay | Admitting: *Deleted

## 2020-12-11 DIAGNOSIS — I82401 Acute embolism and thrombosis of unspecified deep veins of right lower extremity: Secondary | ICD-10-CM

## 2020-12-11 NOTE — Telephone Encounter (Signed)
Please schedule her for a visit if she is willing and I am happy to help with her questions. Let her know that I have a full schedule and will need to have her on the schedule to be able to appropriately assist her.

## 2020-12-11 NOTE — Telephone Encounter (Signed)
Referral faxed to [phone], she is to call [phone] to the New Patient Navigator tomorrow and they will start a new chart for her and get her scheduled. Pt advised.

## 2020-12-11 NOTE — Telephone Encounter (Signed)
Pt called and states that she thinks she has covid been waiting on to get tested but first available is next Monday. She thinks she might have a 4th blood clot again as she had pain in her thigh and now into her groin. She called hematology and asked if she should double up on her xarelto and he didn't meet her needs, so she just doubled up on xarelto. She has congestion and cough since last Friday. She says she only wants to talk to you as the last one she had you talked to her on the phone. Pt was advised to do a virtual visit and said she only needs ultrasound and covid test and doesn't think she needs to drive from [place] to [place] to [place]. Please advise

## 2020-12-11 NOTE — Telephone Encounter (Signed)
Pt called and states that she needs a referral to a new hematologist states the one she had was not taking care of needs. States she has been taking care of her blood clots on her own, She needs a referral to Dr Jill Alexanders in [place] of [facility] there number is [phone]  They also need her medical records send to them before they can make a appt there fax number is [phone]   Please forward that to [facility] to handle medical records

## 2020-12-11 NOTE — Telephone Encounter (Signed)
Pt states she will just go to the ER to be seen as if she does have a blood clot they can treat her for this right then

## 2020-12-11 NOTE — Telephone Encounter (Signed)
Please assist her with this referral. This would be a second opinion I assume?

## 2020-12-13 ENCOUNTER — Telehealth: Payer: Self-pay

## 2020-12-13 NOTE — Telephone Encounter (Signed)
I have fax over referral again

## 2020-12-13 NOTE — Telephone Encounter (Signed)
Pt. Calling back to let you know that she called the Dr. Jill Alexanders office to schedule an apt since she was referred there by [place] the other day. They are still saying they can not see the referral in there system and would need for you to fax a referral to them before they can schedule her. There fax number is [phone] and put attention new pt. Referrals.

## 2020-12-17 ENCOUNTER — Telehealth: Payer: Self-pay | Admitting: Family Medicine

## 2020-12-17 NOTE — Telephone Encounter (Signed)
[facility] called pt has been scheduled with  Dr. Mechele Collin on 12/27/20 @ 1:15 Pt has been informed

## 2021-05-28 ENCOUNTER — Other Ambulatory Visit: Payer: Self-pay | Admitting: Hematology and Oncology

## 2021-05-28 ENCOUNTER — Inpatient Hospital Stay: Payer: 59 | Attending: Hematology and Oncology | Admitting: Hematology and Oncology

## 2021-05-28 ENCOUNTER — Inpatient Hospital Stay: Payer: 59

## 2021-05-28 DIAGNOSIS — I82401 Acute embolism and thrombosis of unspecified deep veins of right lower extremity: Secondary | ICD-10-CM

## 2021-11-01 IMAGING — DX DG CHEST 2V
2 series · 2 of 2 positions shown · non-contrast
Comparison: Report from prior chest radiograph 08/29/2009 (images
unavailable). CT a chest 02/07/2011.

CLINICAL DATA: History of pulmonary embolism. Dyspnea on exertion.
Shortness of breath with activity for 5 months.

EXAM:
CHEST - 2 VIEW

[dg chest 2 view (1 of 2)]
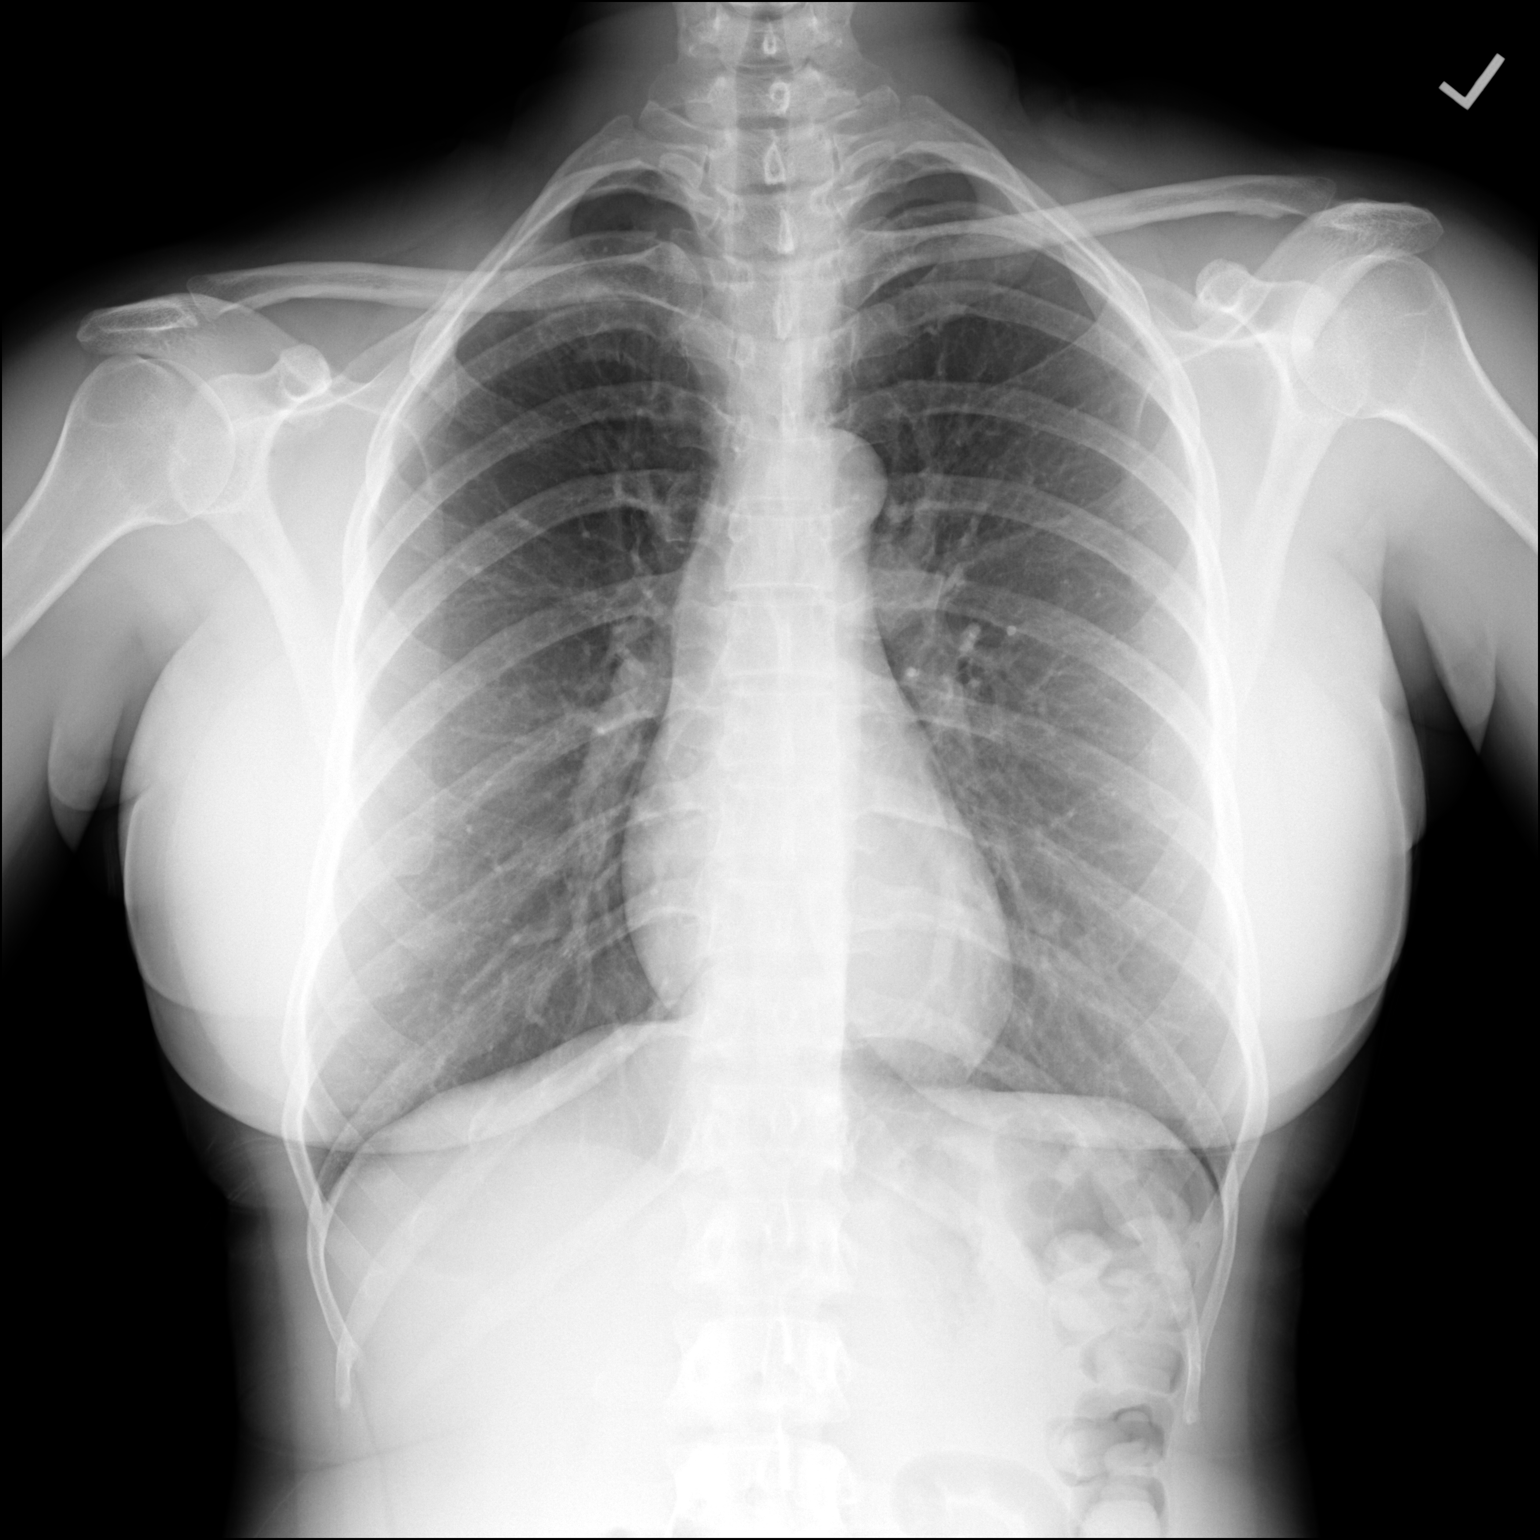

[dg chest 2 view (2 of 2)]
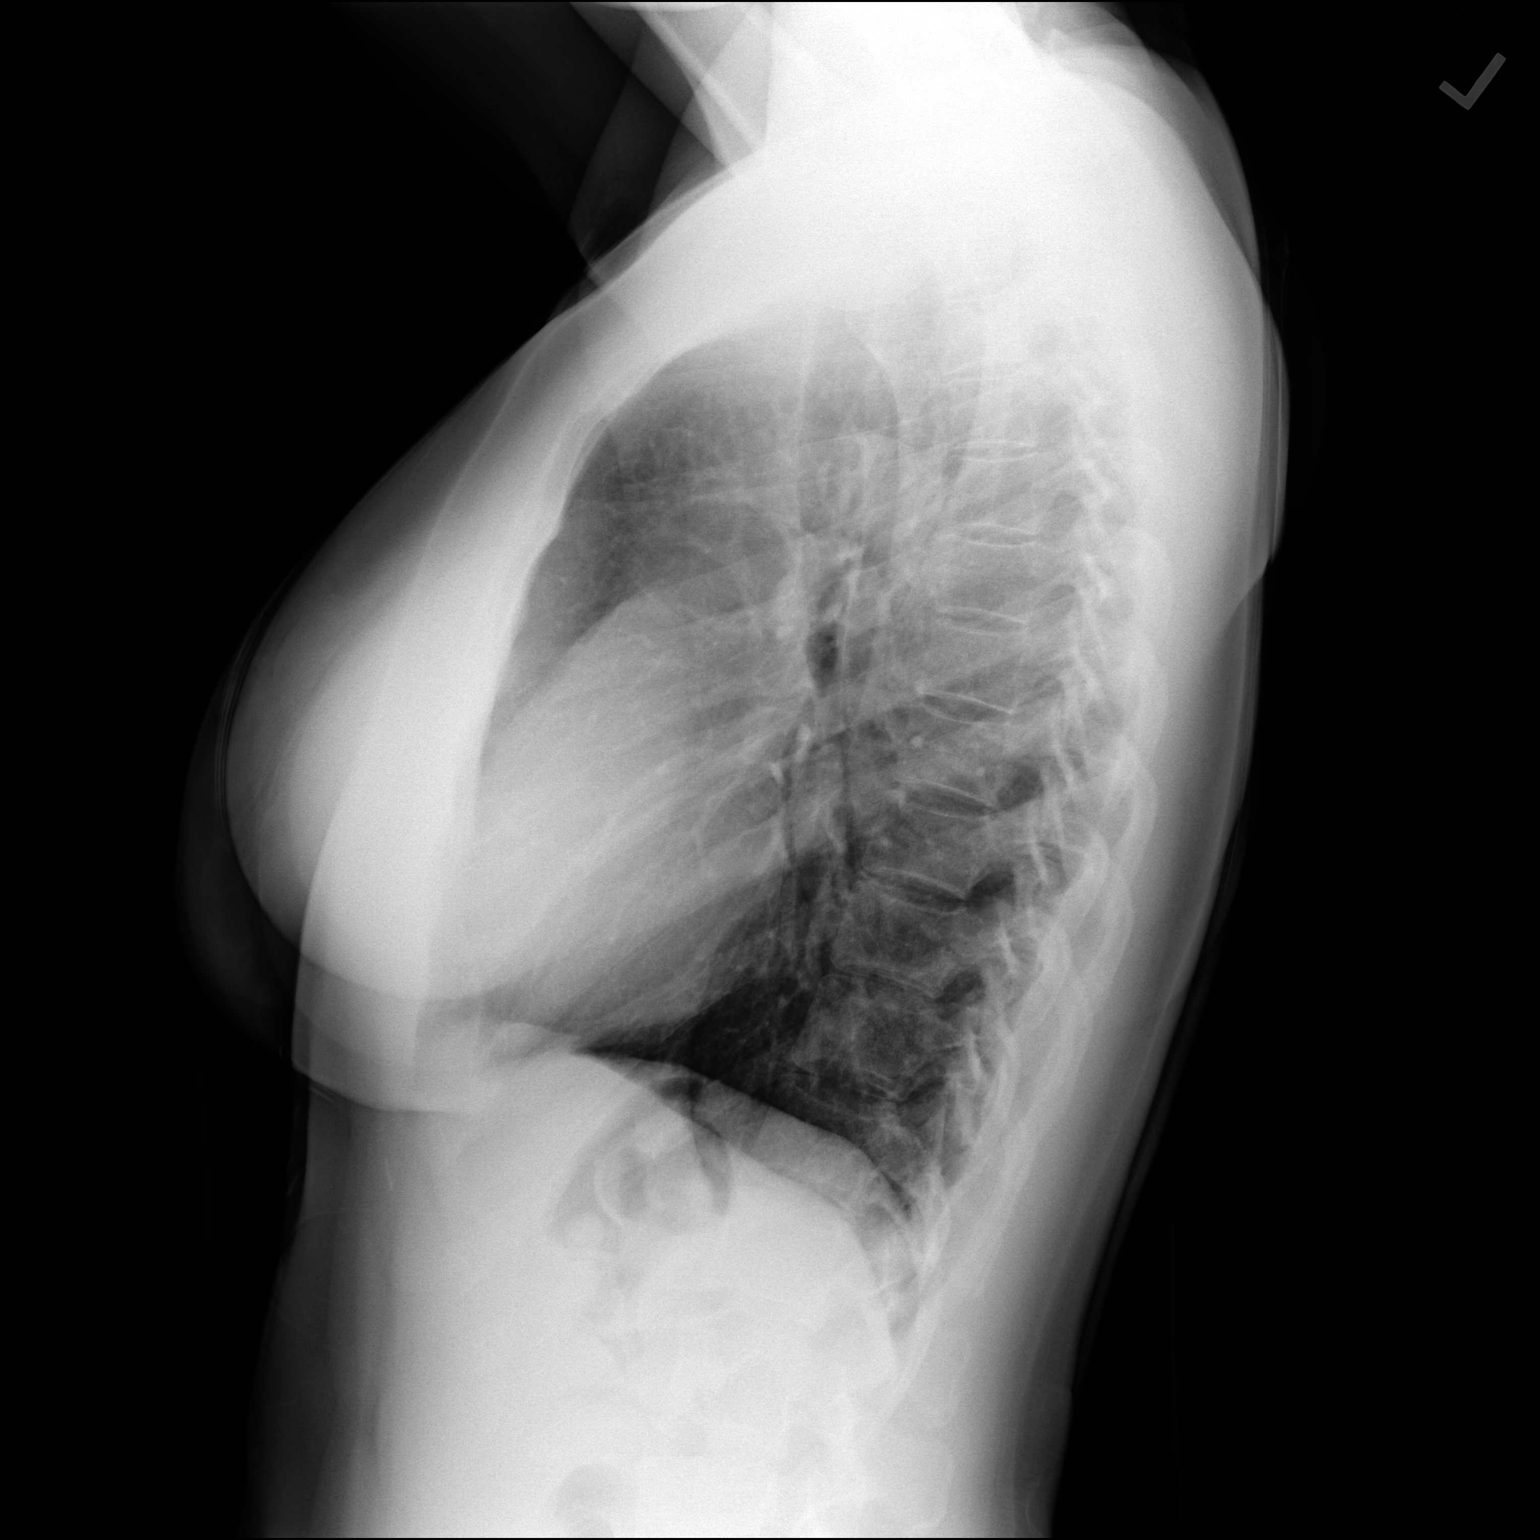

[2 of 2 positions shown; findings below may reference images not displayed]

FINDINGS: Heart size within normal limits.

There is no appreciable airspace consolidation.

No evidence of pleural effusion or pneumothorax.

No acute bony abnormality identified.
IMPRESSION: No active cardiopulmonary disease.

## 2021-12-23 IMAGING — DX DG CHEST 2V
2 series · 2 of 2 positions shown · non-contrast
Comparison: April 26, 2020

CLINICAL DATA: Shortness of breath and cough

EXAM:
CHEST - 2 VIEW

[chest pa]
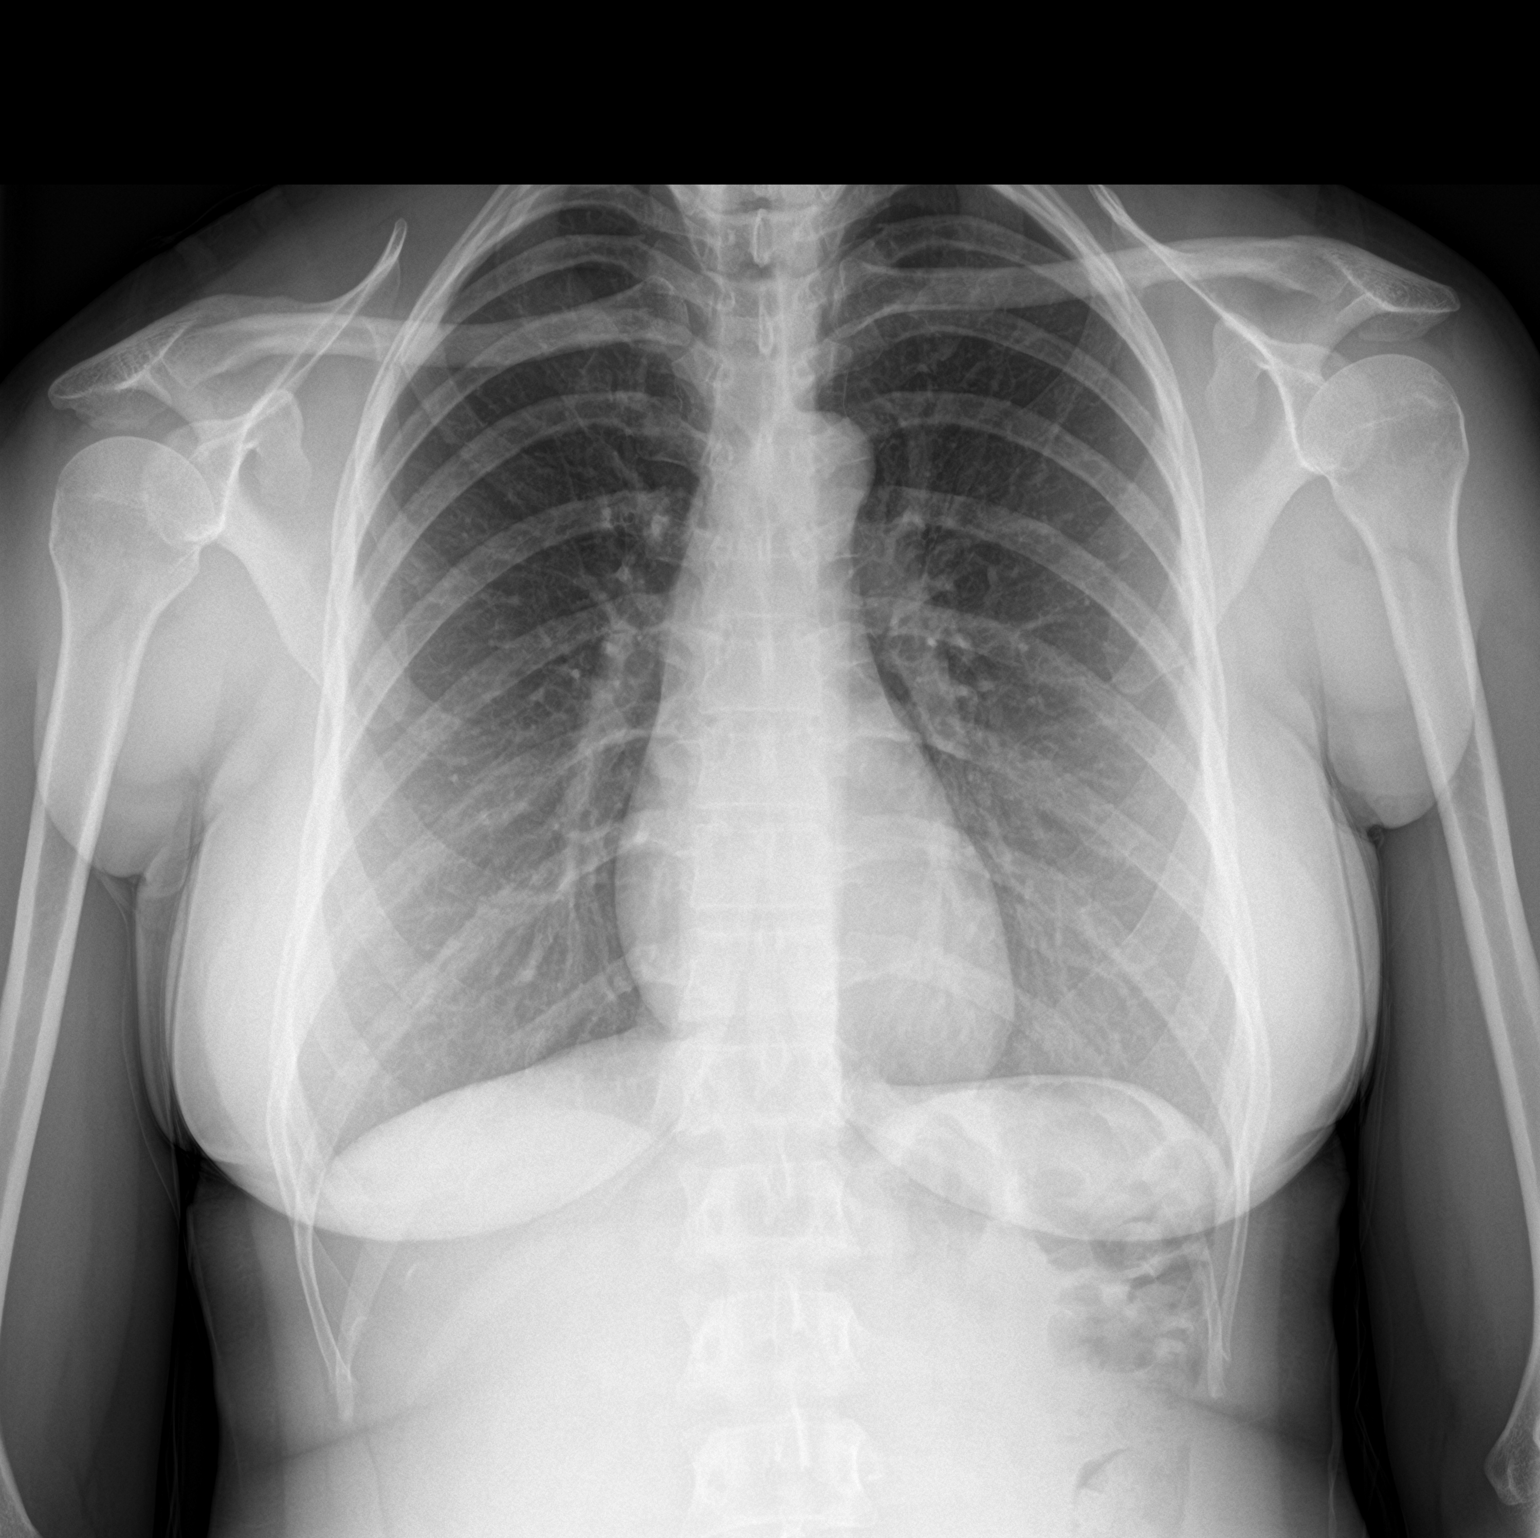

[chest lat]
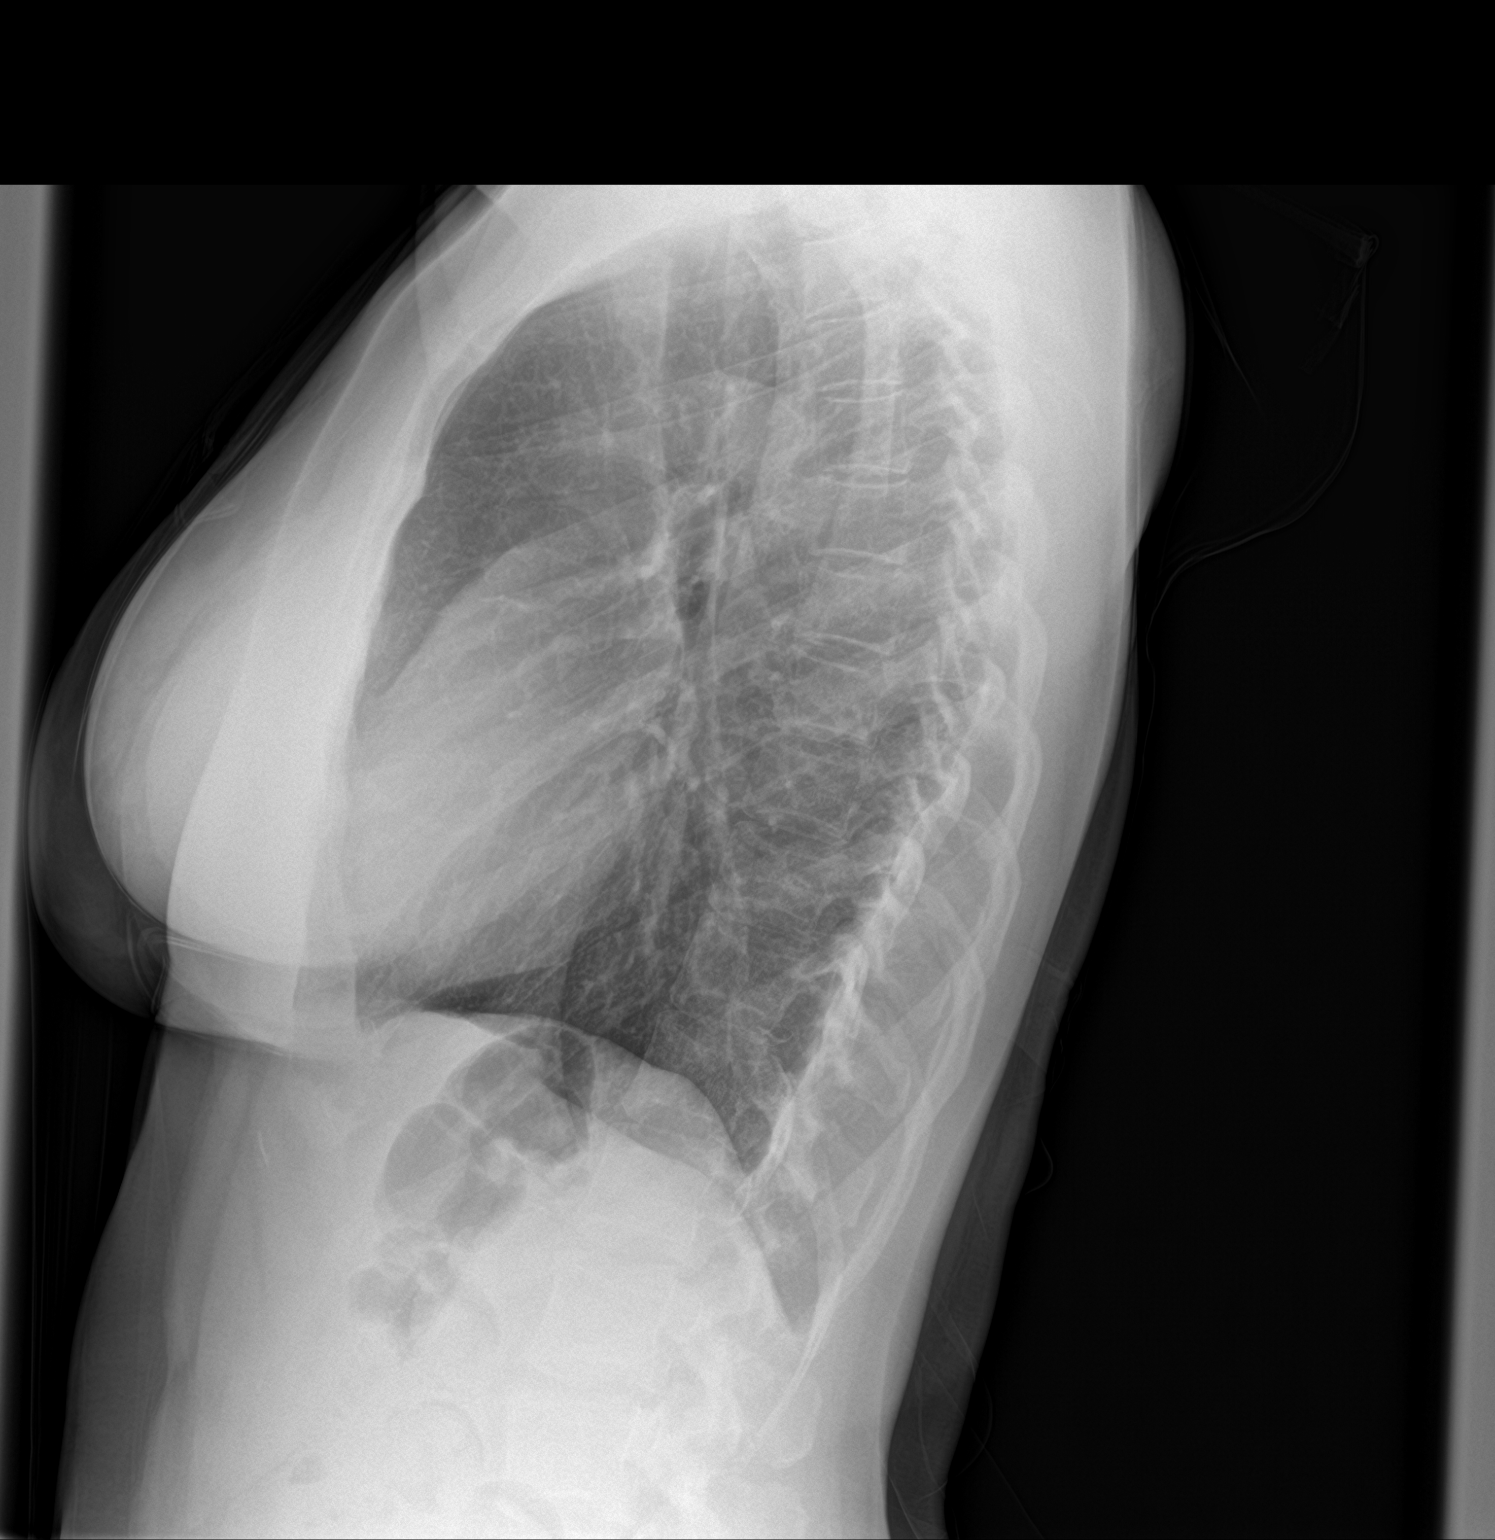

[2 of 2 positions shown; findings below may reference images not displayed]

FINDINGS: Lungs are clear. Heart size and pulmonary vascularity are normal. No
adenopathy. No bone lesions.
IMPRESSION: Lungs clear.  Cardiac silhouette within normal limits.
